# Patient Record
Sex: Female | Born: 1945
Health system: Southern US, Community
[De-identification: ages and names within clinical notes are randomized; demographics above are authoritative.]

## PROBLEM LIST (undated history)

## (undated) DIAGNOSIS — G43909 Migraine, unspecified, not intractable, without status migrainosus: Secondary | ICD-10-CM

## (undated) DIAGNOSIS — M1990 Unspecified osteoarthritis, unspecified site: Secondary | ICD-10-CM

## (undated) DIAGNOSIS — H269 Unspecified cataract: Secondary | ICD-10-CM

## (undated) DIAGNOSIS — E785 Hyperlipidemia, unspecified: Secondary | ICD-10-CM

## (undated) DIAGNOSIS — I10 Essential (primary) hypertension: Secondary | ICD-10-CM

## (undated) HISTORY — PX: ABDOMINAL HYSTERECTOMY: SHX81

## (undated) HISTORY — PX: JOINT REPLACEMENT: SHX530

## (undated) HISTORY — DX: Unspecified cataract: H26.9

## (undated) HISTORY — DX: Hyperlipidemia, unspecified: E78.5

## (undated) HISTORY — DX: Migraine, unspecified, not intractable, without status migrainosus: G43.909

## (undated) HISTORY — PX: ROTATOR CUFF REPAIR: SHX139

## (undated) HISTORY — PX: TONSILLECTOMY: SUR1361

---

## 1998-06-22 ENCOUNTER — Ambulatory Visit (HOSPITAL_COMMUNITY): Admission: RE | Admit: 1998-06-22 | Discharge: 1998-06-22 | Payer: Self-pay | Admitting: Family Medicine

## 1999-06-17 ENCOUNTER — Ambulatory Visit (HOSPITAL_COMMUNITY): Admission: RE | Admit: 1999-06-17 | Discharge: 1999-06-17 | Payer: Self-pay | Admitting: Family Medicine

## 1999-06-17 ENCOUNTER — Encounter: Payer: Self-pay | Admitting: Family Medicine

## 2000-03-28 ENCOUNTER — Encounter: Admission: RE | Admit: 2000-03-28 | Discharge: 2000-03-28 | Payer: Self-pay | Admitting: Family Medicine

## 2000-03-28 ENCOUNTER — Encounter: Payer: Self-pay | Admitting: Family Medicine

## 2000-11-14 ENCOUNTER — Encounter: Payer: Self-pay | Admitting: Family Medicine

## 2000-11-14 ENCOUNTER — Ambulatory Visit (HOSPITAL_COMMUNITY): Admission: RE | Admit: 2000-11-14 | Discharge: 2000-11-14 | Payer: Self-pay | Admitting: Family Medicine

## 2001-05-30 ENCOUNTER — Ambulatory Visit (HOSPITAL_COMMUNITY): Admission: RE | Admit: 2001-05-30 | Discharge: 2001-05-30 | Payer: Self-pay | Admitting: Family Medicine

## 2001-09-09 ENCOUNTER — Ambulatory Visit (HOSPITAL_COMMUNITY): Admission: RE | Admit: 2001-09-09 | Discharge: 2001-09-09 | Payer: Self-pay | Admitting: Gastroenterology

## 2003-05-01 ENCOUNTER — Ambulatory Visit (HOSPITAL_COMMUNITY): Admission: RE | Admit: 2003-05-01 | Discharge: 2003-05-01 | Payer: Self-pay | Admitting: Family Medicine

## 2003-09-29 ENCOUNTER — Ambulatory Visit (HOSPITAL_COMMUNITY): Admission: RE | Admit: 2003-09-29 | Discharge: 2003-09-29 | Payer: Self-pay | Admitting: Family Medicine

## 2004-10-04 ENCOUNTER — Ambulatory Visit (HOSPITAL_COMMUNITY): Admission: RE | Admit: 2004-10-04 | Discharge: 2004-10-04 | Payer: Self-pay | Admitting: Family Medicine

## 2004-10-13 ENCOUNTER — Encounter: Admission: RE | Admit: 2004-10-13 | Discharge: 2004-10-13 | Payer: Self-pay | Admitting: Family Medicine

## 2006-01-16 ENCOUNTER — Ambulatory Visit (HOSPITAL_COMMUNITY): Admission: RE | Admit: 2006-01-16 | Discharge: 2006-01-16 | Payer: Self-pay | Admitting: Family Medicine

## 2006-01-24 ENCOUNTER — Emergency Department (HOSPITAL_COMMUNITY): Admission: EM | Admit: 2006-01-24 | Discharge: 2006-01-25 | Payer: Self-pay | Admitting: Emergency Medicine

## 2006-05-01 ENCOUNTER — Ambulatory Visit (HOSPITAL_COMMUNITY): Admission: RE | Admit: 2006-05-01 | Discharge: 2006-05-01 | Payer: Self-pay | Admitting: Family Medicine

## 2006-08-27 ENCOUNTER — Encounter: Admission: RE | Admit: 2006-08-27 | Discharge: 2006-08-27 | Payer: Self-pay | Admitting: Family Medicine

## 2006-10-14 ENCOUNTER — Encounter: Admission: RE | Admit: 2006-10-14 | Discharge: 2006-10-14 | Payer: Self-pay | Admitting: Family Medicine

## 2007-01-22 ENCOUNTER — Ambulatory Visit (HOSPITAL_COMMUNITY): Admission: RE | Admit: 2007-01-22 | Discharge: 2007-01-22 | Payer: Self-pay | Admitting: Family Medicine

## 2007-02-08 ENCOUNTER — Encounter: Admission: RE | Admit: 2007-02-08 | Discharge: 2007-02-08 | Payer: Self-pay | Admitting: Family Medicine

## 2008-03-03 ENCOUNTER — Encounter: Admission: RE | Admit: 2008-03-03 | Discharge: 2008-03-03 | Payer: Self-pay | Admitting: Family Medicine

## 2009-03-09 ENCOUNTER — Encounter: Admission: RE | Admit: 2009-03-09 | Discharge: 2009-03-09 | Payer: Self-pay | Admitting: Family Medicine

## 2009-04-07 ENCOUNTER — Encounter: Admission: RE | Admit: 2009-04-07 | Discharge: 2009-04-07 | Payer: Self-pay | Admitting: Family Medicine

## 2010-03-14 ENCOUNTER — Encounter: Admission: RE | Admit: 2010-03-14 | Discharge: 2010-03-14 | Payer: Self-pay | Admitting: Family Medicine

## 2010-03-17 ENCOUNTER — Encounter: Admission: RE | Admit: 2010-03-17 | Discharge: 2010-03-17 | Payer: Self-pay | Admitting: Family Medicine

## 2010-04-11 ENCOUNTER — Encounter: Admission: RE | Admit: 2010-04-11 | Discharge: 2010-04-11 | Payer: Self-pay | Admitting: Family Medicine

## 2010-09-16 ENCOUNTER — Encounter: Admission: RE | Admit: 2010-09-16 | Discharge: 2010-09-16 | Payer: Self-pay | Admitting: Family Medicine

## 2010-12-24 ENCOUNTER — Encounter: Payer: Self-pay | Admitting: Family Medicine

## 2010-12-25 ENCOUNTER — Encounter: Payer: Self-pay | Admitting: Family Medicine

## 2011-04-03 ENCOUNTER — Other Ambulatory Visit: Payer: Self-pay | Admitting: Family Medicine

## 2011-04-03 DIAGNOSIS — Z1231 Encounter for screening mammogram for malignant neoplasm of breast: Secondary | ICD-10-CM

## 2011-04-06 ENCOUNTER — Ambulatory Visit
Admission: RE | Admit: 2011-04-06 | Discharge: 2011-04-06 | Disposition: A | Discharge status: Home / Self Care | Payer: Medicare Other | Source: Ambulatory Visit | Attending: Family Medicine | Admitting: Family Medicine

## 2011-04-06 DIAGNOSIS — Z1231 Encounter for screening mammogram for malignant neoplasm of breast: Secondary | ICD-10-CM

## 2011-04-21 NOTE — Procedures (Signed)
[facility]  Patient:    Nancy Yu, Nancy Yu Visit Number: [id number] MRN: [id number]          Service Type: END Location: ENDO Attending Physician:  Orland Mustard Proc. Date: 09/09/01 Admit Date:  09/09/2001   CC:         Carola J. Gerri Spore, M.D.   Procedure Report  PROCEDURE:  Colonoscopy.  MEDICATIONS:  Fentanyl 100 mcg, Versed 10 mg IV.  SCOPE:  Pediatric Olympus video colonoscope.  INDICATION:  Strong family history of colon polyps.  DESCRIPTION OF PROCEDURE:  The procedure had been explained to the patient and consent obtained.  With the patient in the left lateral decubitus position, the Olympus pediatric video colonoscope was inserted and advanced under direct visualization.  The prep was excellent.  We reached the cecum without difficulty.  The scope was withdrawn.  The cecum, ascending colon, hepatic flexure, transverse colon, splenic flexure, descending, and sigmoid colon were seen well upon removed.  No polyps or other lesions were seen.  Internal hemorrhoids were seen in the rectum upon removal of the scope.  The scope was withdrawn.  The patient tolerated the procedure well and was maintained on low-flow oxygen and pulse oximeter throughout the procedure.  ASSESSMENT:  No evidence of colon polyps.  PLAN:  Due to the family history, will recommend repeating in five years. Attending Physician:  Orland Mustard DD:  09/09/01 TD:  09/10/01 Job: [id number]

## 2012-01-03 ENCOUNTER — Other Ambulatory Visit: Payer: Self-pay | Admitting: Family Medicine

## 2012-01-03 ENCOUNTER — Ambulatory Visit
Admission: RE | Admit: 2012-01-03 | Discharge: 2012-01-03 | Disposition: A | Discharge status: Home / Self Care | Payer: Medicare Other | Source: Ambulatory Visit | Attending: Family Medicine | Admitting: Family Medicine

## 2012-01-03 DIAGNOSIS — M549 Dorsalgia, unspecified: Secondary | ICD-10-CM

## 2012-03-04 ENCOUNTER — Other Ambulatory Visit: Payer: Self-pay | Admitting: Family Medicine

## 2012-03-04 DIAGNOSIS — Z1231 Encounter for screening mammogram for malignant neoplasm of breast: Secondary | ICD-10-CM

## 2012-04-08 ENCOUNTER — Ambulatory Visit
Admission: RE | Admit: 2012-04-08 | Discharge: 2012-04-08 | Disposition: A | Discharge status: Home / Self Care | Payer: Medicare Other | Source: Ambulatory Visit | Attending: Family Medicine | Admitting: Family Medicine

## 2012-04-08 DIAGNOSIS — Z1231 Encounter for screening mammogram for malignant neoplasm of breast: Secondary | ICD-10-CM

## 2013-07-14 ENCOUNTER — Other Ambulatory Visit: Payer: Self-pay

## 2013-07-14 DIAGNOSIS — Z1231 Encounter for screening mammogram for malignant neoplasm of breast: Secondary | ICD-10-CM

## 2013-07-31 ENCOUNTER — Ambulatory Visit: Payer: Medicare Other

## 2013-08-12 ENCOUNTER — Ambulatory Visit
Admission: RE | Admit: 2013-08-12 | Discharge: 2013-08-12 | Disposition: A | Discharge status: Home / Self Care | Payer: Medicare Other | Source: Ambulatory Visit

## 2013-08-12 DIAGNOSIS — Z1231 Encounter for screening mammogram for malignant neoplasm of breast: Secondary | ICD-10-CM

## 2014-03-07 ENCOUNTER — Emergency Department (HOSPITAL_BASED_OUTPATIENT_CLINIC_OR_DEPARTMENT_OTHER)
Admission: EM | Admit: 2014-03-07 | Discharge: 2014-03-07 | Disposition: A | Discharge status: Home / Self Care | Payer: Medicare Other | Attending: Emergency Medicine | Admitting: Emergency Medicine

## 2014-03-07 ENCOUNTER — Encounter (HOSPITAL_BASED_OUTPATIENT_CLINIC_OR_DEPARTMENT_OTHER): Payer: Self-pay | Admitting: Emergency Medicine

## 2014-03-07 ENCOUNTER — Emergency Department (HOSPITAL_BASED_OUTPATIENT_CLINIC_OR_DEPARTMENT_OTHER): Payer: Medicare Other

## 2014-03-07 DIAGNOSIS — M129 Arthropathy, unspecified: Secondary | ICD-10-CM | POA: Insufficient documentation

## 2014-03-07 DIAGNOSIS — W19XXXA Unspecified fall, initial encounter: Secondary | ICD-10-CM | POA: Diagnosis present

## 2014-03-07 DIAGNOSIS — I10 Essential (primary) hypertension: Secondary | ICD-10-CM | POA: Insufficient documentation

## 2014-03-07 DIAGNOSIS — Y9289 Other specified places as the place of occurrence of the external cause: Secondary | ICD-10-CM | POA: Insufficient documentation

## 2014-03-07 DIAGNOSIS — Z79899 Other long term (current) drug therapy: Secondary | ICD-10-CM | POA: Insufficient documentation

## 2014-03-07 DIAGNOSIS — Y93H2 Activity, gardening and landscaping: Secondary | ICD-10-CM | POA: Insufficient documentation

## 2014-03-07 DIAGNOSIS — Z7982 Long term (current) use of aspirin: Secondary | ICD-10-CM | POA: Insufficient documentation

## 2014-03-07 DIAGNOSIS — IMO0002 Reserved for concepts with insufficient information to code with codable children: Secondary | ICD-10-CM | POA: Insufficient documentation

## 2014-03-07 DIAGNOSIS — S0993XA Unspecified injury of face, initial encounter: Secondary | ICD-10-CM | POA: Insufficient documentation

## 2014-03-07 DIAGNOSIS — W010XXA Fall on same level from slipping, tripping and stumbling without subsequent striking against object, initial encounter: Secondary | ICD-10-CM | POA: Insufficient documentation

## 2014-03-07 DIAGNOSIS — S0081XA Abrasion of other part of head, initial encounter: Secondary | ICD-10-CM | POA: Diagnosis present

## 2014-03-07 DIAGNOSIS — S199XXA Unspecified injury of neck, initial encounter: Secondary | ICD-10-CM

## 2014-03-07 DIAGNOSIS — Z23 Encounter for immunization: Secondary | ICD-10-CM | POA: Insufficient documentation

## 2014-03-07 HISTORY — DX: Essential (primary) hypertension: I10

## 2014-03-07 HISTORY — DX: Unspecified osteoarthritis, unspecified site: M19.90

## 2014-03-07 MED ORDER — ACETAMINOPHEN 325 MG PO TABS
650.0000 mg | ORAL_TABLET | Freq: Once | ORAL | Status: AC
  Administered 2014-03-07: 650 mg via ORAL
  Filled 2014-03-07: qty 2

## 2014-03-07 MED ORDER — TETANUS-DIPHTH-ACELL PERTUSSIS 5-2.5-18.5 LF-MCG/0.5 IM SUSP
0.5000 mL | Freq: Once | INTRAMUSCULAR | Status: AC
  Administered 2014-03-07: 0.5 mL via INTRAMUSCULAR
  Filled 2014-03-07: qty 0.5

## 2014-03-07 NOTE — Discharge Instructions (Signed)
Abrasion °An abrasion is a cut or scrape of the skin. Abrasions do not extend through all layers of the skin and most heal within 10 days. It is important to care for your abrasion properly to prevent infection. °CAUSES  °Most abrasions are caused by falling on, or gliding across, the ground or other surface. When your skin rubs on something, the outer and inner layer of skin rubs off, causing an abrasion. °DIAGNOSIS  °Your caregiver will be able to diagnose an abrasion during a physical exam.  °TREATMENT  °Your treatment depends on how large and deep the abrasion is. Generally, your abrasion will be cleaned with water and a mild soap to remove any dirt or debris. An antibiotic ointment may be put over the abrasion to prevent an infection. A bandage (dressing) may be wrapped around the abrasion to keep it from getting dirty.  °You may need a tetanus shot if: °· You cannot remember when you had your last tetanus shot. °· You have never had a tetanus shot. °· The injury broke your skin. °If you get a tetanus shot, your arm may swell, get red, and feel warm to the touch. This is common and not a problem. If you need a tetanus shot and you choose not to have one, there is a rare chance of getting tetanus. Sickness from tetanus can be serious.  °HOME CARE INSTRUCTIONS  °· If a dressing was applied, change it at least once a day or as directed by your caregiver. If the bandage sticks, soak it off with warm water.   °· Wash the area with water and a mild soap to remove all the ointment 2 times a day. Rinse off the soap and pat the area dry with a clean towel.   °· Reapply any ointment as directed by your caregiver. This will help prevent infection and keep the bandage from sticking. Use gauze over the wound and under the dressing to help keep the bandage from sticking.   °· Change your dressing right away if it becomes wet or dirty.   °· Only take over-the-counter or prescription medicines for pain, discomfort, or fever as  directed by your caregiver.   °· Follow up with your caregiver within 24 48 hours for a wound check, or as directed. If you were not given a wound-check appointment, look closely at your abrasion for redness, swelling, or pus. These are signs of infection. °SEEK IMMEDIATE MEDICAL CARE IF:  °· You have increasing pain in the wound.   °· You have redness, swelling, or tenderness around the wound.   °· You have pus coming from the wound.   °· You have a fever or persistent symptoms for more than 2 3 days. °· You have a fever and your symptoms suddenly get worse. °· You have a bad smell coming from the wound or dressing.   °MAKE SURE YOU:  °· Understand these instructions. °· Will watch your condition. °· Will get help right away if you are not doing well or get worse. °Document Released: 08/30/2005 Document Revised: 11/06/2012 Document Reviewed: 10/24/2011 °[employer]® Patient Information ©2014 [employer]. ° °

## 2014-03-07 NOTE — ED Provider Notes (Signed)
CSN: [id number]     Arrival date & time 03/07/14  1700 History  This chart was scribed for Blanchard Kelch, MD by Mercy Moore, ED scribe.  This patient was seen in room MH06/MH06 and the patient's care was started at 5:42 PM.   Chief Complaint  Patient presents with  . Fall      Patient is a 68 y.o. female presenting with fall. The history is provided by the patient. No language interpreter was used.  Fall This is a new problem. The current episode started 1 to 2 hours ago. Episode frequency: once. The problem has been resolved. Pertinent negatives include no chest pain, no abdominal pain, no headaches and no shortness of breath. Nothing aggravates the symptoms. Nothing relieves the symptoms. She has tried nothing for the symptoms. The treatment provided no relief.   HPI Comments: Nancy Yu is a 68 y.o. female who presents to the Emergency Department after a fall that occurred two hours ago. Patient reports watering flowers this afternoon, she tripped and fell onto cement head first. Patient reports that her head took the brunt of the fall because she was trying to protect her shoulders. She had recent surgery on her shoulder.   Did not lose LOC Sore neck, back  Certain she is UTD on Tetanus  Past Medical History  Diagnosis Date  . Arthritis   . Hypertension    Past Surgical History  Procedure Laterality Date  . Abdominal hysterectomy    . Tonsillectomy    . Rotator cuff repair     No family history on file. History  Substance Use Topics  . Smoking status: Never Smoker   . Smokeless tobacco: Never Used  . Alcohol Use: No   OB History   Grav Para Term Preterm Abortions TAB SAB Ect Mult Living                 Review of Systems  Constitutional: Negative for fever.  HENT: Negative for congestion and sore throat.   Eyes: Negative for pain.  Respiratory: Negative for choking, shortness of breath and wheezing.   Cardiovascular: Negative for chest pain.   Gastrointestinal: Negative for vomiting, abdominal pain and diarrhea.  Genitourinary: Negative for dysuria.  Musculoskeletal: Positive for neck pain.  Skin: Negative for rash.  Allergic/Immunologic: Negative for immunocompromised state.  Neurological: Negative for headaches.  Hematological: Negative for adenopathy.  Psychiatric/Behavioral: Negative for behavioral problems.      Allergies  Review of patient's allergies indicates no known allergies.  Home Medications   Current Outpatient Rx  Name  Route  Sig  Dispense  Refill  . amLODipine (NORVASC) 5 MG tablet   Oral   Take 5 mg by mouth daily.         Marland Kitchen aspirin 81 MG tablet   Oral   Take 81 mg by mouth daily.         . cetirizine (ZYRTEC) 10 MG tablet   Oral   Take 10 mg by mouth daily.         . cholecalciferol (VITAMIN D) 1000 UNITS tablet   Oral   Take 1,000 Units by mouth daily.         Marland Kitchen CRANBERRY EXTRACT PO   Oral   Take by mouth.         . fenofibrate 160 MG tablet   Oral   Take 160 mg by mouth daily.         . Fish Oil OIL  Does not apply   by Does not apply route.         . metoprolol (LOPRESSOR) 100 MG tablet   Oral   Take 100 mg by mouth 2 (two) times daily.         . Multiple Vitamin (MULTIVITAMIN) capsule   Oral   Take 1 capsule by mouth daily.          Triage Vitals: BP 170/67  Pulse 61  Temp(Src) 98.1 F (36.7 C) (Oral)  Resp 20  Ht 5\' 3"  (1.6 m)  Wt 168 lb (76.204 kg)  BMI 29.77 kg/m2  SpO2 98% Physical Exam  Nursing note and vitals reviewed. Constitutional: She is oriented to person, place, and time. She appears well-developed and well-nourished. No distress.  HENT:  Head: Normocephalic.  Mouth/Throat: Oropharynx is clear and moist. No oropharyngeal exudate.  Multiple mild abrasions to face. No focal tenderness of face.   Eyes: Conjunctivae and EOM are normal. Pupils are equal, round, and reactive to light. Right eye exhibits no discharge. Left eye  exhibits no discharge.  Neck: Neck supple. No tracheal deviation present.  Mild mid cervical tenderness to palpation.  Cardiovascular: Normal rate, regular rhythm, normal heart sounds and intact distal pulses.  Exam reveals no gallop and no friction rub.   No murmur heard. Pulmonary/Chest: Effort normal and breath sounds normal. No respiratory distress. She has no wheezes. She has no rales. She exhibits no tenderness.  Abdominal: Soft. Bowel sounds are normal. She exhibits no distension and no mass. There is no tenderness. There is no rebound and no guarding.  Musculoskeletal: Normal range of motion. She exhibits no edema and no tenderness.  Normal range of motion of hips without pain.  Normal strength and sensation in all extremities.   Neurological: She is alert and oriented to person, place, and time.  Skin: Skin is warm and dry. No rash noted.  Psychiatric: She has a normal mood and affect. Her behavior is normal.    ED Course  Procedures (including critical care time) DIAGNOSTIC STUDIES: Oxygen Saturation is 98% on room air, normal by my interpretation.    COORDINATION OF CARE: 5:43 PM- Pt advised of plan for treatment and pt agrees.    Labs Review Labs Reviewed - No data to display Imaging Review Ct Head Wo Contrast  03/07/2014   CLINICAL DATA:  Fall.  EXAM: CT HEAD WITHOUT CONTRAST  CT MAXILLOFACIAL WITHOUT CONTRAST  CT CERVICAL SPINE WITHOUT CONTRAST  TECHNIQUE: Multidetector CT imaging of the head, cervical spine, and maxillofacial structures were performed using the standard protocol without intravenous contrast. Multiplanar CT image reconstructions of the cervical spine and maxillofacial structures were also generated.  COMPARISON:  CT HEAD W/O CM dated 01/25/2006; MRI Cervical Spine w/o contrast dated 05/17/2009  FINDINGS: CT HEAD FINDINGS  No mass. No hydrocephalus. No hemorrhage. Frontal soft tissue swelling is noted. No underlying fracture.  CT MAXILLOFACIAL FINDINGS  Soft  tissue swelling is in the frontal region. No underlying fracture. Visualized paranasal sinuses are clear. Zygomas are clear. Orbits are intact. Nasal septal deviation to the left is 5 present. Nasal spurring is noted. Mandible is intact and in place.  CT CERVICAL SPINE FINDINGS  Noted in the right paraspinal region adjacent to T2 and to a lesser extent the left paraspinal region is soft tissue prominence. Although these changes could represent pleural parenchymal scarring a posterior mediastinum/ paraspinal mass lesion cannot be excluded. In addition mild esophageal wall thickening is noted. Nonemergent CT of the  chest with contrast enhancement suggested for further evaluation of these findings. Diffuse degenerative changes present of the cervical spine. Prominent endplate osteophyte formation, severe disc degeneration and diffuse severe facet hypertrophy is present. Disc degeneration is particularly severe C5-C6 and C6-C7. Partial congenital fusion of C2-C3 is noted. Mild anterolisthesis C6 on C7 present. This is most likely degenerative. Ligamentous injury cannot be completely excluded.  IMPRESSION: 1. Mild soft tissue swelling noted over the frontal region. No underlying acute bony abnormality. No acute intracranial abnormality. 2. Paraspinal soft tissue prominence upper thoracic spine particularly along the right aspect of T2. This may just represent pleural parenchymal scarring. However significant mass lesion cannot be excluded. In addition mild esophageal wall thickening is noted. Nonemergent contrast-enhanced chest CT is suggested for further evaluation of these findings. 3. Severe degenerative changes of the cervical spine. No evidence of fracture or dislocation.   Electronically Signed   By: Sayner   On: 03/07/2014 18:53   Ct Cervical Spine Wo Contrast  03/07/2014   CLINICAL DATA:  Fall.  EXAM: CT HEAD WITHOUT CONTRAST  CT MAXILLOFACIAL WITHOUT CONTRAST  CT CERVICAL SPINE WITHOUT CONTRAST   TECHNIQUE: Multidetector CT imaging of the head, cervical spine, and maxillofacial structures were performed using the standard protocol without intravenous contrast. Multiplanar CT image reconstructions of the cervical spine and maxillofacial structures were also generated.  COMPARISON:  CT HEAD W/O CM dated 01/25/2006; MRI Cervical Spine w/o contrast dated 05/17/2009  FINDINGS: CT HEAD FINDINGS  No mass. No hydrocephalus. No hemorrhage. Frontal soft tissue swelling is noted. No underlying fracture.  CT MAXILLOFACIAL FINDINGS  Soft tissue swelling is in the frontal region. No underlying fracture. Visualized paranasal sinuses are clear. Zygomas are clear. Orbits are intact. Nasal septal deviation to the left is 5 present. Nasal spurring is noted. Mandible is intact and in place.  CT CERVICAL SPINE FINDINGS  Noted in the right paraspinal region adjacent to T2 and to a lesser extent the left paraspinal region is soft tissue prominence. Although these changes could represent pleural parenchymal scarring a posterior mediastinum/ paraspinal mass lesion cannot be excluded. In addition mild esophageal wall thickening is noted. Nonemergent CT of the chest with contrast enhancement suggested for further evaluation of these findings. Diffuse degenerative changes present of the cervical spine. Prominent endplate osteophyte formation, severe disc degeneration and diffuse severe facet hypertrophy is present. Disc degeneration is particularly severe C5-C6 and C6-C7. Partial congenital fusion of C2-C3 is noted. Mild anterolisthesis C6 on C7 present. This is most likely degenerative. Ligamentous injury cannot be completely excluded.  IMPRESSION: 1. Mild soft tissue swelling noted over the frontal region. No underlying acute bony abnormality. No acute intracranial abnormality. 2. Paraspinal soft tissue prominence upper thoracic spine particularly along the right aspect of T2. This may just represent pleural parenchymal scarring.  However significant mass lesion cannot be excluded. In addition mild esophageal wall thickening is noted. Nonemergent contrast-enhanced chest CT is suggested for further evaluation of these findings. 3. Severe degenerative changes of the cervical spine. No evidence of fracture or dislocation.   Electronically Signed   By: Marcello Moores  Register   On: 03/07/2014 18:53   Ct Maxillofacial Wo Cm  03/07/2014   CLINICAL DATA:  Fall.  EXAM: CT HEAD WITHOUT CONTRAST  CT MAXILLOFACIAL WITHOUT CONTRAST  CT CERVICAL SPINE WITHOUT CONTRAST  TECHNIQUE: Multidetector CT imaging of the head, cervical spine, and maxillofacial structures were performed using the standard protocol without intravenous contrast. Multiplanar CT image reconstructions of the cervical spine and  maxillofacial structures were also generated.  COMPARISON:  CT HEAD W/O CM dated 01/25/2006; MRI Cervical Spine w/o contrast dated 05/17/2009  FINDINGS: CT HEAD FINDINGS  No mass. No hydrocephalus. No hemorrhage. Frontal soft tissue swelling is noted. No underlying fracture.  CT MAXILLOFACIAL FINDINGS  Soft tissue swelling is in the frontal region. No underlying fracture. Visualized paranasal sinuses are clear. Zygomas are clear. Orbits are intact. Nasal septal deviation to the left is 5 present. Nasal spurring is noted. Mandible is intact and in place.  CT CERVICAL SPINE FINDINGS  Noted in the right paraspinal region adjacent to T2 and to a lesser extent the left paraspinal region is soft tissue prominence. Although these changes could represent pleural parenchymal scarring a posterior mediastinum/ paraspinal mass lesion cannot be excluded. In addition mild esophageal wall thickening is noted. Nonemergent CT of the chest with contrast enhancement suggested for further evaluation of these findings. Diffuse degenerative changes present of the cervical spine. Prominent endplate osteophyte formation, severe disc degeneration and diffuse severe facet hypertrophy is present.  Disc degeneration is particularly severe C5-C6 and C6-C7. Partial congenital fusion of C2-C3 is noted. Mild anterolisthesis C6 on C7 present. This is most likely degenerative. Ligamentous injury cannot be completely excluded.  IMPRESSION: 1. Mild soft tissue swelling noted over the frontal region. No underlying acute bony abnormality. No acute intracranial abnormality. 2. Paraspinal soft tissue prominence upper thoracic spine particularly along the right aspect of T2. This may just represent pleural parenchymal scarring. However significant mass lesion cannot be excluded. In addition mild esophageal wall thickening is noted. Nonemergent contrast-enhanced chest CT is suggested for further evaluation of these findings. 3. Severe degenerative changes of the cervical spine. No evidence of fracture or dislocation.   Electronically Signed   By: Marcello Moores  Register   On: 03/07/2014 18:53     EKG Interpretation None      MDM   Final diagnoses:  Abrasion of face  Fall    7:23 PM 68 y.o. female who presents with a mechanical fall prior to arrival. She denies loss of consciousness. Afebrile and vital signs are unremarkable here. Will get screening imaging.  7:23 PM: I interpreted/reviewed the labs and/or imaging which were non-contributory.   I have discussed the diagnosis/risks/treatment options with the patient and family and believe the pt to be eligible for discharge home to follow-up with pcp as needed. We also discussed returning to the ED immediately if new or worsening sx occur. We discussed the sx which are most concerning (e.g., worsening pain) that necessitate immediate return. Medications administered to the patient during their visit and any new prescriptions provided to the patient are listed below.  Medications given during this visit Medications  acetaminophen (TYLENOL) tablet 650 mg (650 mg Oral Given 03/07/14 1753)  Tdap (BOOSTRIX) injection 0.5 mL (0.5 mLs Intramuscular Given 03/07/14 1758)     New Prescriptions   No medications on file      I personally performed the services described in this documentation, which was scribed in my presence. The recorded information has been reviewed and is accurate.    Blanchard Kelch, MD 03/08/14 1215

## 2014-03-07 NOTE — ED Notes (Signed)
Returned from CT.

## 2014-03-07 NOTE — ED Notes (Signed)
Patient transported to X-ray 

## 2014-03-07 NOTE — ED Notes (Signed)
Pt reports she tripped on grden hose and fell and landed on her face- abrasions present to face- denies LOC

## 2014-07-08 ENCOUNTER — Other Ambulatory Visit: Payer: Self-pay

## 2014-07-08 DIAGNOSIS — Z1231 Encounter for screening mammogram for malignant neoplasm of breast: Secondary | ICD-10-CM

## 2014-08-14 ENCOUNTER — Ambulatory Visit: Payer: Medicare Other

## 2014-08-27 ENCOUNTER — Encounter (INDEPENDENT_AMBULATORY_CARE_PROVIDER_SITE_OTHER): Payer: Self-pay

## 2014-08-27 ENCOUNTER — Ambulatory Visit
Admission: RE | Admit: 2014-08-27 | Discharge: 2014-08-27 | Disposition: A | Discharge status: Home / Self Care | Payer: Medicare Other | Source: Ambulatory Visit

## 2014-08-27 DIAGNOSIS — Z1231 Encounter for screening mammogram for malignant neoplasm of breast: Secondary | ICD-10-CM

## 2014-08-31 ENCOUNTER — Other Ambulatory Visit: Payer: Self-pay | Admitting: Family Medicine

## 2014-08-31 DIAGNOSIS — R928 Other abnormal and inconclusive findings on diagnostic imaging of breast: Secondary | ICD-10-CM

## 2014-09-04 ENCOUNTER — Ambulatory Visit
Admission: RE | Admit: 2014-09-04 | Discharge: 2014-09-04 | Disposition: A | Discharge status: Home / Self Care | Payer: Medicare Other | Source: Ambulatory Visit | Attending: Family Medicine | Admitting: Family Medicine

## 2014-09-04 DIAGNOSIS — R928 Other abnormal and inconclusive findings on diagnostic imaging of breast: Secondary | ICD-10-CM

## 2015-08-11 ENCOUNTER — Other Ambulatory Visit: Payer: Self-pay

## 2015-08-11 DIAGNOSIS — Z1231 Encounter for screening mammogram for malignant neoplasm of breast: Secondary | ICD-10-CM

## 2015-09-15 ENCOUNTER — Ambulatory Visit
Admission: RE | Admit: 2015-09-15 | Discharge: 2015-09-15 | Disposition: A | Discharge status: Home / Self Care | Payer: Medicare Other | Source: Ambulatory Visit

## 2015-09-15 DIAGNOSIS — Z1231 Encounter for screening mammogram for malignant neoplasm of breast: Secondary | ICD-10-CM

## 2016-01-05 ENCOUNTER — Emergency Department (HOSPITAL_COMMUNITY): Payer: Medicare Other

## 2016-01-05 ENCOUNTER — Emergency Department (HOSPITAL_COMMUNITY)
Admission: EM | Admit: 2016-01-05 | Discharge: 2016-01-05 | Disposition: A | Discharge status: Home / Self Care | Payer: Medicare Other | Attending: Emergency Medicine | Admitting: Emergency Medicine

## 2016-01-05 ENCOUNTER — Encounter (HOSPITAL_COMMUNITY): Payer: Self-pay | Admitting: Emergency Medicine

## 2016-01-05 DIAGNOSIS — I493 Ventricular premature depolarization: Secondary | ICD-10-CM

## 2016-01-05 DIAGNOSIS — Z79899 Other long term (current) drug therapy: Secondary | ICD-10-CM | POA: Insufficient documentation

## 2016-01-05 DIAGNOSIS — R740 Nonspecific elevation of levels of transaminase and lactic acid dehydrogenase [LDH]: Secondary | ICD-10-CM | POA: Insufficient documentation

## 2016-01-05 DIAGNOSIS — M1990 Unspecified osteoarthritis, unspecified site: Secondary | ICD-10-CM | POA: Diagnosis not present

## 2016-01-05 DIAGNOSIS — R531 Weakness: Secondary | ICD-10-CM

## 2016-01-05 DIAGNOSIS — I10 Essential (primary) hypertension: Secondary | ICD-10-CM | POA: Insufficient documentation

## 2016-01-05 DIAGNOSIS — Z7982 Long term (current) use of aspirin: Secondary | ICD-10-CM | POA: Insufficient documentation

## 2016-01-05 DIAGNOSIS — R7401 Elevation of levels of liver transaminase levels: Secondary | ICD-10-CM

## 2016-01-05 DIAGNOSIS — R079 Chest pain, unspecified: Secondary | ICD-10-CM | POA: Diagnosis present

## 2016-01-05 LAB — URINALYSIS, ROUTINE W REFLEX MICROSCOPIC
BILIRUBIN URINE: NEGATIVE
GLUCOSE, UA: NEGATIVE mg/dL
Hgb urine dipstick: NEGATIVE
Ketones, ur: NEGATIVE mg/dL
Leukocytes, UA: NEGATIVE
NITRITE: NEGATIVE
PH: 7 (ref 5.0–8.0)
Protein, ur: NEGATIVE mg/dL
SPECIFIC GRAVITY, URINE: 1.008 (ref 1.005–1.030)

## 2016-01-05 LAB — BASIC METABOLIC PANEL
Anion gap: 13 (ref 5–15)
BUN: 17 mg/dL (ref 6–20)
CALCIUM: 10.9 mg/dL — AB (ref 8.9–10.3)
CHLORIDE: 100 mmol/L — AB (ref 101–111)
CO2: 27 mmol/L (ref 22–32)
CREATININE: 0.79 mg/dL (ref 0.44–1.00)
GFR calc Af Amer: >60 mL/min (ref >60)
GFR calc non Af Amer: >60 mL/min (ref >60)
GLUCOSE: 135 mg/dL — AB (ref 65–99)
Potassium: 3.8 mmol/L (ref 3.5–5.1)
Sodium: 140 mmol/L (ref 135–145)

## 2016-01-05 LAB — CBC
HCT: 47.3 % — ABNORMAL HIGH (ref 36.0–46.0)
Hemoglobin: 16.9 g/dL — ABNORMAL HIGH (ref 12.0–15.0)
MCH: 30.8 pg (ref 26.0–34.0)
MCHC: 35.7 g/dL (ref 30.0–36.0)
MCV: 86.3 fL (ref 78.0–100.0)
PLATELETS: 164 10*3/uL (ref 150–400)
RBC: 5.48 MIL/uL — AB (ref 3.87–5.11)
RDW: 12.5 % (ref 11.5–15.5)
WBC: 5.5 10*3/uL (ref 4.0–10.5)

## 2016-01-05 LAB — HEPATIC FUNCTION PANEL
ALBUMIN: 4.6 g/dL (ref 3.5–5.0)
ALK PHOS: 57 U/L (ref 38–126)
ALT: 67 U/L — ABNORMAL HIGH (ref 14–54)
AST: 62 U/L — ABNORMAL HIGH (ref 15–41)
BILIRUBIN DIRECT: 0.2 mg/dL (ref 0.1–0.5)
BILIRUBIN TOTAL: 0.5 mg/dL (ref 0.3–1.2)
Indirect Bilirubin: 0.3 mg/dL (ref 0.3–0.9)
Total Protein: 7.5 g/dL (ref 6.5–8.1)

## 2016-01-05 LAB — I-STAT TROPONIN, ED: TROPONIN I, POC: 0.01 ng/mL (ref 0.00–0.08)

## 2016-01-05 MED ORDER — POTASSIUM CHLORIDE CRYS ER 20 MEQ PO TBCR: 20.0000 meq | EXTENDED_RELEASE_TABLET | Freq: Every day | ORAL | Status: DC

## 2016-01-05 MED ORDER — POTASSIUM CHLORIDE CRYS ER 20 MEQ PO TBCR
40.0000 meq | EXTENDED_RELEASE_TABLET | Freq: Once | ORAL | Status: AC
  Administered 2016-01-05: 40 meq via ORAL
  Filled 2016-01-05: qty 2

## 2016-01-05 NOTE — ED Provider Notes (Signed)
CSN: [id number]     Arrival date & time 01/05/16  1748 History   First MD Initiated Contact with Patient 01/05/16 [facility]     Chief Complaint  Patient presents with  . Chest Pain  . Fatigue     (Consider location/radiation/quality/duration/timing/severity/associated sxs/prior Treatment) Patient is a 70 y.o. female presenting with chest pain. The history is provided by the patient.  Chest Pain She complains of feeling generally weak for the last 4 days. She denies fever, chills, sweats. She denies chest pain to me but noted that her blood pressure monitor showed her heart rate in the 40s. She denies dizziness or lightheadedness. She denies nausea or vomiting. She denies dyspnea or cough and denies dysuria. Her main concern today is actually her low heart rate.  Past Medical History  Diagnosis Date  . Arthritis   . Hypertension    Past Surgical History  Procedure Laterality Date  . Abdominal hysterectomy    . Tonsillectomy    . Rotator cuff repair     No family history on file. Social History  Substance Use Topics  . Smoking status: Never Smoker   . Smokeless tobacco: Never Used  . Alcohol Use: No   OB History    No data available     Review of Systems  Cardiovascular: Positive for chest pain.  All other systems reviewed and are negative.     Allergies  Review of patient's allergies indicates no known allergies.  Home Medications   Prior to Admission medications   Medication Sig Start Date End Date Taking? Authorizing Provider  amLODipine (NORVASC) 5 MG tablet Take 5 mg by mouth daily.    Historical Provider, MD  aspirin 81 MG tablet Take 81 mg by mouth daily.    Historical Provider, MD  cetirizine (ZYRTEC) 10 MG tablet Take 10 mg by mouth daily.    Historical Provider, MD  cholecalciferol (VITAMIN D) 1000 UNITS tablet Take 1,000 Units by mouth daily.    Historical Provider, MD  CRANBERRY EXTRACT PO Take by mouth.    Historical Provider, MD  fenofibrate 160 MG tablet  Take 160 mg by mouth daily.    Historical Provider, MD  Fish Oil OIL by Does not apply route.    Historical Provider, MD  metoprolol (LOPRESSOR) 100 MG tablet Take 100 mg by mouth 2 (two) times daily.    Historical Provider, MD  Multiple Vitamin (MULTIVITAMIN) capsule Take 1 capsule by mouth daily.    Historical Provider, MD   BP 173/101 mmHg  Pulse 70  Temp(Src) 97.7 F (36.5 C) (Oral)  Resp 18  SpO2 99% Physical Exam  Nursing note and vitals reviewed.  70 year old female, resting comfortably and in no acute distress. Vital signs are significant for hypertension. Oxygen saturation is 99%, which is normal. Head is normocephalic and atraumatic. PERRLA, EOMI. Oropharynx is clear. Neck is nontender and supple without adenopathy or JVD. Back is nontender and there is no CVA tenderness. Lungs are clear without rales, wheezes, or rhonchi. Chest is nontender. Heart has regular rate and rhythm with frequent premature beats. No murmur is heard. Abdomen is soft, flat, nontender without masses or hepatosplenomegaly and peristalsis is normoactive. Extremities have no cyanosis or edema, full range of motion is present. Skin is warm and dry without rash. Neurologic: Mental status is normal, cranial nerves are intact, there are no motor or sensory deficits.  ED Course  Procedures (including critical care time) Labs Review Results for orders placed or performed during  the hospital encounter of [id number]  Basic metabolic panel  Result Value Ref Range   Sodium 140 135 - 145 mmol/L   Potassium 3.8 3.5 - 5.1 mmol/L   Chloride 100 (L) 101 - 111 mmol/L   CO2 27 22 - 32 mmol/L   Glucose, Bld 135 (H) 65 - 99 mg/dL   BUN 17 6 - 20 mg/dL   Creatinine, Ser 0.79 0.44 - 1.00 mg/dL   Calcium 10.9 (H) 8.9 - 10.3 mg/dL   GFR calc non Af Amer >60 >60 mL/min   GFR calc Af Amer >60 >60 mL/min   Anion gap 13 5 - 15  CBC  Result Value Ref Range   WBC 5.5 4.0 - 10.5 K/uL   RBC 5.48 (H) 3.87 - 5.11 MIL/uL    Hemoglobin 16.9 (H) 12.0 - 15.0 g/dL   HCT 47.3 (H) 36.0 - 46.0 %   MCV 86.3 78.0 - 100.0 fL   MCH 30.8 26.0 - 34.0 pg   MCHC 35.7 30.0 - 36.0 g/dL   RDW 12.5 11.5 - 15.5 %   Platelets 164 150 - 400 K/uL  I-stat troponin, ED (not at [facility], [facility])  Result Value Ref Range   Troponin i, poc 0.01 0.00 - 0.08 ng/mL   Comment 3           Imaging Review Dg Chest 2 View  01/05/2016  CLINICAL DATA:  Shortness of breath, mid chest pain, intermittent right arm pain and weakness for 1 week. EXAM: CHEST  2 VIEW COMPARISON:  None. FINDINGS: Heart is normal in size. There is tortuosity of the thoracic aorta. The lungs are clear. Pulmonary vasculature is normal. No consolidation, pleural effusion, or pneumothorax. No acute osseous abnormalities are seen. Findings consistent with diffuse idiopathic skeletal hyperostosis unchanged compared to a thoracic spine radiograph 01/03/2012. IMPRESSION: No acute pulmonary process. Electronically Signed   By: Jeb Levering M.D.   On: 01/05/2016 18:36   I have personally reviewed and evaluated these images and lab results as part of my medical decision-making.   EKG Interpretation   Date/Time:  Wednesday January 05 2016 17:58:00 EST Ventricular Rate:  76 PR Interval:  172 QRS Duration: 108 QT Interval:  432 QTC Calculation: 486 R Axis:   -53 Text Interpretation:  Sinus rhythm with frequent Premature ventricular  complexes and Premature atrial complexes Left anterior fascicular block  Left ventricular hypertrophy with repolarization abnormality Abnormal ECG  When compared with ECG of 01/25/2006, Premature ventricular complexes are  now Present Left anterior fasicular block is now Present Left ventricular  hypertrophy with repolarization abnormality is now Present Confirmed by  [facility]  MD, Zorian Gunderman ([id number]) on 01/05/2016 6:34:23 PM      MDM   Final diagnoses:  Weakness  PVC's (premature ventricular contractions)  Elevated transaminase level  Hypercalcemia     Generalized weakness of uncertain cause. Monitor and ECG show frequent PVCs with episodes of bigeminy. This seems to be what is behind her blood pressure monitor interpreting low heart rate. Have explained this to the patient. Potassium is noted to be 3.8 which, while still in the normal range, might be leading to some increase tendency towards PVCs. She is given a dose of K-Dur. CBC is normal and metabolic panel is significant only for borderline hypercalcemia which is probably not clinically significant. Urinalysis is pending. Old records are reviewed and she has no relevant past visits.  She feels better after above noted treatment. Frequency of PVCs seems to have decreased. Remainder of  laboratory workup is significant for mild elevation of AST and ALT which is unlikely to be responsible for her symptoms but will need to be followed, along with her calcium. Patient is advised of these findings. She is discharged with prescription for K-dur and she is to follow-up with her PCP in one week. Return precautions given.  Delora Fuel, MD [id number]

## 2016-01-05 NOTE — ED Notes (Signed)
Pt reports that she has been having weakness x 4 days with intermittent CP starting last night. Pt went to her MD and her initial HR was 44, they captured an EKG but her HR had improved to 70's. Pt alert x4. Pt having intermittent runs of Bi-Gem on monitor.

## 2016-01-05 NOTE — Discharge Instructions (Signed)
Your blood tests today showed slightly elevated calcium, AST, ALT. These need to be rechecked by her doctor in the next 1-2 weeks.  Weakness Weakness is a lack of strength. It may be felt all over the body (generalized) or in one specific part of the body (focal). Some causes of weakness can be serious. You may need further medical evaluation, especially if you are elderly or you have a history of immunosuppression (such as chemotherapy or HIV), kidney disease, heart disease, or diabetes. CAUSES  Weakness can be caused by many different things, including:  Infection.  Physical exhaustion.  Internal bleeding or other blood loss that results in a lack of red blood cells (anemia).  Dehydration. This cause is more common in elderly people.  Side effects or electrolyte abnormalities from medicines, such as pain medicines or sedatives.  Emotional distress, anxiety, or depression.  Circulation problems, especially severe peripheral arterial disease.  Heart disease, such as rapid atrial fibrillation, bradycardia, or heart failure.  Nervous system disorders, such as Guillain-Barr syndrome, multiple sclerosis, or stroke. DIAGNOSIS  To find the cause of your weakness, your caregiver will take your history and perform a physical exam. Lab tests or X-rays may also be ordered, if needed. TREATMENT  Treatment of weakness depends on the cause of your symptoms and can vary greatly. HOME CARE INSTRUCTIONS   Rest as needed.  Eat a well-balanced diet.  Try to get some exercise every day.  Only take over-the-counter or prescription medicines as directed by your caregiver. SEEK MEDICAL CARE IF:   Your weakness seems to be getting worse or spreads to other parts of your body.  You develop new aches or pains. SEEK IMMEDIATE MEDICAL CARE IF:   You cannot perform your normal daily activities, such as getting dressed and feeding yourself.  You cannot walk up and down stairs, or you feel exhausted  when you do so.  You have shortness of breath or chest pain.  You have difficulty moving parts of your body.  You have weakness in only one area of the body or on only one side of the body.  You have a fever.  You have trouble speaking or swallowing.  You cannot control your bladder or bowel movements.  You have black or bloody vomit or stools. MAKE SURE YOU:  Understand these instructions.  Will watch your condition.  Will get help right away if you are not doing well or get worse.   This information is not intended to replace advice given to you by your health care provider. Make sure you discuss any questions you have with your health care provider.   Document Released: 11/20/2005 Document Revised: 05/21/2012 Document Reviewed: 01/19/2012 Elsevier Interactive Patient Education 2016 Elsevier Inc.  Potassium Salts tablets, extended-release tablets or capsules What is this medicine? POTASSIUM (poe TASS i um) is a natural salt that is important for the heart, muscles, and nerves. It is found in many foods and is normally supplied by a well balanced diet. This medicine is used to treat low potassium. This medicine may be used for other purposes; ask your health care provider or pharmacist if you have questions. What should I tell my health care provider before I take this medicine? They need to know if you have any of these conditions: -Addison's disease -dehydration -diabetes -difficulty swallowing -heart disease -history of high levels of potassium in the blood -irregular heartbeat -kidney disease -recent severe burn -stomach ulcers or other stomach problems -an unusual or allergic reaction to  potassium, tartrazine, other medicines, foods, dyes, or preservatives -pregnant or trying to get pregnant -breast-feeding How should I use this medicine? Take this medicine by mouth with a full glass of water. Take with food. Follow the directions on the prescription label. Do  not suck on, crush, or chew this medicine. If you have difficulty swallowing, ask the pharmacist how to take. Take your medicine at regular intervals. Do not take it more often than directed. Do not stop taking except on your doctor's advice. Talk to your pediatrician regarding the use of this medicine in children. Special care may be needed. Overdosage: If you think you have taken too much of this medicine contact a poison control center or emergency room at once. NOTE: This medicine is only for you. Do not share this medicine with others. What if I miss a dose? If you miss a dose, take it as soon as you can. If it is almost time for your next dose, take only that dose. Do not take double or extra doses. What may interact with this medicine? Do not take this medicine with any of the following medications: -eplerenone -certain medicines for stomach problems like atropine; difenoxin and glycopyrrolate -sodium polystyrene sulfonate This medicine may also interact with the following medications: -certain medicines for blood pressure or heart disease like lisinopril, losartan, quinapril, valsartan -medicines for cold or allergies -NSAIDs, medicines for pain and inflammation, like ibuprofen or napoxen -other potassium supplements -salt substitutes -some diuretics This list may not describe all possible interactions. Give your health care provider a list of all the medicines, herbs, non-prescription drugs, or dietary supplements you use. Also tell them if you smoke, drink alcohol, or use illegal drugs. Some items may interact with your medicine. What should I watch for while using this medicine? Visit your doctor or health care professional for regular check ups. You will need lab work done regularly. You may need to be on a special diet while taking this medicine. Ask your doctor. What side effects may I notice from receiving this medicine? Side effects that you should report to your doctor or  health care professional as soon as possible: -allergic reactions like skin rash, itching or hives, swelling of the face, lips, or tongue -anxious -black, tarry stools -breathing problems -confusion -heartburn -irregular heartbeat -numbness or tingling in hands or feet -pain when swallowing -unusually weak or tired -weakness, heaviness of legs Side effects that usually do not require medical attention (report to your doctor or health care professional if they continue or are bothersome): -diarrhea -nausea -upset stomach -vomiting This list may not describe all possible side effects. Call your doctor for medical advice about side effects. You may report side effects to FDA at 1-800-FDA-1088. Where should I keep my medicine? Keep out of the reach of children. Store at room temperature between 15 and 30 degrees C (59 and 86 degrees F ). Keep bottle closed tightly to protect this medicine from light and moisture. Throw away any unused medicine after the expiration date. NOTE: This sheet is a summary. It may not cover all possible information. If you have questions about this medicine, talk to your doctor, pharmacist, or health care provider.    2016, Elsevier/Gold Standard. (2015-04-29 08:55:21)

## 2016-01-25 ENCOUNTER — Encounter: Payer: Self-pay | Admitting: Cardiovascular Disease

## 2016-01-25 ENCOUNTER — Ambulatory Visit (INDEPENDENT_AMBULATORY_CARE_PROVIDER_SITE_OTHER): Payer: Medicare Other | Admitting: Cardiovascular Disease

## 2016-01-25 VITALS — BP 160/70 | HR 60 | Ht 63.0 in | Wt 174.2 lb

## 2016-01-25 DIAGNOSIS — I10 Essential (primary) hypertension: Secondary | ICD-10-CM

## 2016-01-25 DIAGNOSIS — I493 Ventricular premature depolarization: Secondary | ICD-10-CM | POA: Diagnosis not present

## 2016-01-25 DIAGNOSIS — R0602 Shortness of breath: Secondary | ICD-10-CM | POA: Diagnosis not present

## 2016-01-25 DIAGNOSIS — R0600 Dyspnea, unspecified: Secondary | ICD-10-CM

## 2016-01-25 DIAGNOSIS — R0609 Other forms of dyspnea: Secondary | ICD-10-CM | POA: Diagnosis not present

## 2016-01-25 MED ORDER — CARVEDILOL 12.5 MG PO TABS: 12.5000 mg | ORAL_TABLET | Freq: Two times a day (BID) | ORAL | Status: DC

## 2016-01-25 NOTE — Patient Instructions (Signed)
Medication Instructions:  Your physician has recommended you make the following change in your medication:  1. STOP Metoprolol Tartrate 2. START Carvedilol 12.5mg  take one tablet by mouth twice a day  Labwork: No new orders.   Testing/Procedures: Your physician has requested that you have an exercise stress myoview. For further information please visit [email or web address]. Please follow instruction sheet, as given.  Your physician has recommended that you wear a 48 hour holter monitor. Holter monitors are medical devices that record the heart's electrical activity. Doctors most often use these monitors to diagnose arrhythmias. Arrhythmias are problems with the speed or rhythm of the heartbeat. The monitor is a small, portable device. You can wear one while you do your normal daily activities. This is usually used to diagnose what is causing palpitations/syncope (passing out).  Follow-Up: Your physician recommends that you schedule a follow-up appointment in: 1 MONTH with Dr Fletcher Anon   Any Other Special Instructions Will Be Listed Below (If Applicable).     If you need a refill on your cardiac medications before your next appointment, please call your pharmacy.

## 2016-01-30 ENCOUNTER — Encounter: Payer: Self-pay | Admitting: Cardiovascular Disease

## 2016-01-30 DIAGNOSIS — R0609 Other forms of dyspnea: Secondary | ICD-10-CM

## 2016-01-30 DIAGNOSIS — I10 Essential (primary) hypertension: Secondary | ICD-10-CM | POA: Insufficient documentation

## 2016-01-30 DIAGNOSIS — R0600 Dyspnea, unspecified: Secondary | ICD-10-CM | POA: Insufficient documentation

## 2016-01-30 DIAGNOSIS — I493 Ventricular premature depolarization: Secondary | ICD-10-CM | POA: Insufficient documentation

## 2016-01-30 NOTE — Assessment & Plan Note (Signed)
We have to exclude ischemic heart disease as the culprit for her symptoms. Thus, I requested a nuclear stress test. Given her frequent PVCs, a treadmill stress test alone is not sufficient.

## 2016-01-30 NOTE — Assessment & Plan Note (Signed)
The patient is having frequent than symptomatic PVCs. I suspect that the bradycardia is likely due to his continuity due to frequent PVCs. I requested a 48-hour Holter monitor to quantify her PVCs. Thyroid function should be checked if not already done.

## 2016-01-30 NOTE — Assessment & Plan Note (Signed)
It is not ideal for her to hold such a large dose of metoprolol which might lead to beta blocker withdrawal. Her blood pressure continues to be elevated and thus I elected to switch metoprolol to carvedilol 12.5 mg twice daily. I asked her not to hold the dose.

## 2016-01-30 NOTE — Progress Notes (Signed)
HPI   this is a 70 year old female who was referred by Dr. Justin Mend for evaluation of bradycardia and exertional dyspnea. She has no previous cardiac history. She has known history of essential hypertension and has been on amlodipine and metoprolol for many years. She has known history of elevated triglyceride with borderline diabetes. She is not a smoker and has no family history of coronary artery disease. She has experienced increased exertional dyspnea over the last few months with fluctuation in her blood pressure and heart rate. She monitors her blood pressure and pulse at home and usually she does not take metoprolol if heart rate is less than 60. She is on 100 mg twice daily. She had few episodes where her heart rate was in the 40s. During that time she feels very weak and tired. She denies any chest pain but she does complain of arm and back pain. She hasn't been doing much activities and exercise lately due to feeling tired. She went to the emergency room recently for these complaints. Her labs were overall unremarkable except for slightly abnormal liver enzymes. EKG was remarkable for very frequent PVCs. The patient reports being under increased stress lately.  No Known Allergies   Current Outpatient Prescriptions on File Prior to Visit  Medication Sig Dispense Refill  . amLODipine (NORVASC) 5 MG tablet Take 5 mg by mouth daily.    Marland Kitchen aspirin 81 MG tablet Take 81 mg by mouth daily.    . Calcium Carbonate-Vitamin D (CALCIUM 600/VITAMIN D PO) Take 1 tablet by mouth daily.    . cholecalciferol (VITAMIN D) 1000 UNITS tablet Take 1,000 Units by mouth daily.    Marland Kitchen CRANBERRY EXTRACT PO Take 2 tablets by mouth daily.     . fenofibrate 160 MG tablet Take 160 mg by mouth daily.    . Omega-3 Fatty Acids (FISH OIL) 1000 MG CAPS Take 1,000 mg by mouth 2 (two) times daily.      No current facility-administered medications on file prior to visit.     Past Medical History  Diagnosis Date  .  Arthritis   . Hypertension   . Hyperlipidemia      Past Surgical History  Procedure Laterality Date  . Abdominal hysterectomy    . Tonsillectomy    . Rotator cuff repair       Family History  Problem Relation Age of Onset  . Cancer Mother     deceased 9  . Cancer Father     deceased 42     Social History   Social History  . Marital Status: Married    Spouse Name: N/A  . Number of Children: 1  . Years of Education: N/A   Occupational History  . Not on file.   Social History Main Topics  . Smoking status: Never Smoker   . Smokeless tobacco: Never Used  . Alcohol Use: No  . Drug Use: No  . Sexual Activity: Not on file   Other Topics Concern  . Not on file   Social History Narrative     ROS A 10 point review of system was performed. It is negative other than that mentioned in the history of present illness.   PHYSICAL EXAM   BP 160/70 mmHg  Pulse 60  Ht 5\' 3"  (1.6 m)  Wt 174 lb 3 oz (79.011 kg)  BMI 30.86 kg/m2 Constitutional: She is oriented to person, place, and time. She appears well-developed and well-nourished. No distress.  HENT: No nasal discharge.  Head: Normocephalic and atraumatic.  Eyes: Pupils are equal and round. No discharge.  Neck: Normal range of motion. Neck supple. No JVD present. No thyromegaly present.  Cardiovascular: Normal rate with premature beats, regular rhythm, normal heart sounds. Exam reveals no gallop and no friction rub. No murmur heard.  Pulmonary/Chest: Effort normal and breath sounds normal. No stridor. No respiratory distress. She has no wheezes. She has no rales. She exhibits no tenderness.  Abdominal: Soft. Bowel sounds are normal. She exhibits no distension. There is no tenderness. There is no rebound and no guarding.  Musculoskeletal: Normal range of motion. She exhibits no edema and no tenderness.  Neurological: She is alert and oriented to person, place, and time. Coordination normal.  Skin: Skin is warm and  dry. No rash noted. She is not diaphoretic. No erythema. No pallor.  Psychiatric: She has a normal mood and affect. Her behavior is normal. Judgment and thought content normal.     EKG: Recent EKG was reviewed which showed sinus rhythm with frequent PVCs.   ASSESSMENT AND PLAN

## 2016-02-01 ENCOUNTER — Telehealth: Payer: Self-pay | Admitting: Cardiovascular Disease

## 2016-02-01 ENCOUNTER — Encounter (HOSPITAL_COMMUNITY): Payer: Medicare Other

## 2016-02-01 DIAGNOSIS — I493 Ventricular premature depolarization: Secondary | ICD-10-CM

## 2016-02-01 DIAGNOSIS — R0609 Other forms of dyspnea: Principal | ICD-10-CM

## 2016-02-01 DIAGNOSIS — R0600 Dyspnea, unspecified: Secondary | ICD-10-CM

## 2016-02-01 NOTE — Telephone Encounter (Signed)
Pt states that she has been taking the Coreg as prescribed and has felt fine until yesterday. States yesterday she felt weak, had slight SOB and "heart feels like it's beating hard". Denies CP, lightheadedness or dizziness. Vitals at 3pm yesterday- 107/51, HR43.  Last night vitals up to 137/75, HR 68.  Vitals this morning 135/67, HR 63. Pt did not take Coreg last night due to HR being 43 yesterday. Pt has not taken Coreg this morning either. Pt states she feels a little jittery this morning. Pt states that she had these feelings occasionally prior to switching to Coreg so she is not sure where it may be coming from. Advised pt I will route to Dr Fletcher Anon and his nurse Ander Purpura for review, advisement and follow up.

## 2016-02-01 NOTE — Telephone Encounter (Signed)
New message      Pt c/o medication issue:  1. Name of Medication: carvedilol 2. How are you currently taking this medication (dosage and times per day)? 12.5mg  bid 3. Are you having a reaction (difficulty breathing--STAT)?  Pt states that it is hard to breathe  4. What is your medication issue?  Pt states that her bp is 107/51 HR 43 and she feels "jittery" and "weak".  She states it is "hard to breathe"   She started feeling this way yesterday afternoon.  Should she take her medication this am?

## 2016-02-02 NOTE — Telephone Encounter (Signed)
Left message on machine for pt to contact the office.  Pt's tests are scheduled for 02/08/2016.  We need to try and arrange earlier testing.

## 2016-02-02 NOTE — Telephone Encounter (Signed)
I think she is having a lot of PVCs. Did she get the Holter monitor done? How about other cardiac studies. We should expedite these. Continue same treatment for now.

## 2016-02-03 NOTE — Telephone Encounter (Signed)
Stress test is now scheduled for 02/04/16 at [facility] and holter to be applied in our office after stress test on 02/04/16. Pt has been notified.

## 2016-02-03 NOTE — Telephone Encounter (Signed)
Spoke with schedulers and Brantley Fling will need to be done at [facility] if needed before 02/08/16.  I spoke with pt and gave her information from Dr. Fletcher Anon and told her test would need to be done at [facility].    She reports heart rate is running 46-78.  She is concerned about taking Coreg twice daily and sometimes has only been taking once daily.  I instructed her Dr. Fletcher Anon would like her to continue same treatment plan and she should take Coreg twice daily.

## 2016-02-03 NOTE — Telephone Encounter (Signed)
Follow up     Pt is returning rn call

## 2016-02-04 ENCOUNTER — Encounter (HOSPITAL_COMMUNITY): Admission: RE | Admit: 2016-02-04 | Payer: Medicare Other | Source: Ambulatory Visit

## 2016-02-04 ENCOUNTER — Encounter (HOSPITAL_COMMUNITY)
Admission: RE | Admit: 2016-02-04 | Discharge: 2016-02-04 | Disposition: A | Discharge status: Home / Self Care | Payer: Medicare Other | Source: Ambulatory Visit | Attending: Cardiovascular Disease | Admitting: Cardiovascular Disease

## 2016-02-04 ENCOUNTER — Ambulatory Visit (INDEPENDENT_AMBULATORY_CARE_PROVIDER_SITE_OTHER): Payer: Medicare Other

## 2016-02-04 DIAGNOSIS — I493 Ventricular premature depolarization: Secondary | ICD-10-CM

## 2016-02-04 DIAGNOSIS — R0609 Other forms of dyspnea: Secondary | ICD-10-CM

## 2016-02-04 DIAGNOSIS — R0600 Dyspnea, unspecified: Secondary | ICD-10-CM

## 2016-02-04 LAB — NM MYOCAR MULTI W/SPECT W/WALL MOTION / EF
CHL CUP MPHR: 149 {beats}/min
CHL CUP NUCLEAR SDS: 5
CHL CUP NUCLEAR SSS: 13
CSEPEW: 1 METS
CSEPHR: 67 %
CSEPPHR: 101 {beats}/min
Exercise duration (min): 5 min
Exercise duration (sec): 15 s
LHR: 0.32
LV sys vol: 31 mL
LVDIAVOL: 72 mL
Rest HR: 67 {beats}/min
SRS: 8
TID: 1.3

## 2016-02-04 MED ORDER — REGADENOSON 0.4 MG/5ML IV SOLN
INTRAVENOUS | Status: AC
  Administered 2016-02-04: 0.4 mg via INTRAVENOUS
  Filled 2016-02-04: qty 5

## 2016-02-04 MED ORDER — REGADENOSON 0.4 MG/5ML IV SOLN
0.4000 mg | Freq: Once | INTRAVENOUS | Status: AC
  Administered 2016-02-04: 0.4 mg via INTRAVENOUS

## 2016-02-04 MED ORDER — TECHNETIUM TC 99M SESTAMIBI GENERIC - CARDIOLITE
30.0000 | Freq: Once | INTRAVENOUS | Status: AC | PRN
  Administered 2016-02-04: 30 via INTRAVENOUS

## 2016-02-04 MED ORDER — TECHNETIUM TC 99M SESTAMIBI GENERIC - CARDIOLITE
10.0000 | Freq: Once | INTRAVENOUS | Status: AC | PRN
  Administered 2016-02-04: 10 via INTRAVENOUS

## 2016-02-04 NOTE — Progress Notes (Signed)
GXT changed to Lexiscan due to MS Issues.  Lexiscan MV performed. No immediate complications.  Rosaria Ferries, PA-C 02/04/2016 10:10 AM Beeper [phone]

## 2016-02-04 NOTE — Progress Notes (Signed)
3 mins, Rhonda PA at bedside, pt reports some ongoing SOB, denies flushed feeling, chest pain, or abd discomfort

## 2016-02-04 NOTE — Progress Notes (Signed)
Pt presents with bilateral ankle pain that is chronic pain and Left hip that started last night. Pt also states she took her second dose of Coreg 12.5 mg last night at 2100 (3/2) This RN spoke with Suanne Marker PA and a verbal order was given to change patients treadmill test stress test to a [place] RN

## 2016-02-04 NOTE — Progress Notes (Signed)
1 min, Rhonda PA at bedside, pt reports some SOB and flushed. Pt denies chest pain or abd discomfort

## 2016-02-04 NOTE — Progress Notes (Addendum)
5 mins, Rhonda PA at bedside, pt reports SOB has subsided, pt continues to deny chest pain, abd discomfort. Test ended

## 2016-02-08 ENCOUNTER — Encounter (HOSPITAL_COMMUNITY): Payer: Medicare Other

## 2016-02-08 ENCOUNTER — Inpatient Hospital Stay (HOSPITAL_COMMUNITY): Admission: RE | Admit: 2016-02-08 | Payer: Medicare Other | Source: Ambulatory Visit

## 2016-02-17 ENCOUNTER — Telehealth: Payer: Self-pay | Admitting: Cardiovascular Disease

## 2016-02-17 DIAGNOSIS — I493 Ventricular premature depolarization: Secondary | ICD-10-CM

## 2016-02-17 MED ORDER — DILTIAZEM HCL ER COATED BEADS 120 MG PO CP24: 120.0000 mg | ORAL_CAPSULE | Freq: Every day | ORAL | Status: DC

## 2016-02-17 NOTE — Telephone Encounter (Signed)
-----   Message from Wellington Hampshire, MD sent at 02/14/2016  4:16 PM EDT ----- Inform patient that Holter monitor showed significant amount of PVCs. I recommend switching Amlodipine to Diltiazem ER 120 mg once daily. Schedule an echocardiogram and follow up after that with me.

## 2016-02-17 NOTE — Telephone Encounter (Signed)
Spoke with patient. She will stop Amlodipine and tomorrow will start Diltiazem ER 120 mg.   Echo scheduled for tomorrow 3/17 at 3:00 pm Will keep her appointment with Dr. Fletcher Anon at [facility] on 02/22/16.

## 2016-02-17 NOTE — Telephone Encounter (Signed)
Follow Up: ° ° ° ° °Returning call from yesterday. °

## 2016-02-18 ENCOUNTER — Other Ambulatory Visit: Payer: Self-pay

## 2016-02-18 ENCOUNTER — Ambulatory Visit (HOSPITAL_COMMUNITY): Payer: Medicare Other | Attending: Internal Medicine

## 2016-02-18 DIAGNOSIS — I119 Hypertensive heart disease without heart failure: Secondary | ICD-10-CM | POA: Insufficient documentation

## 2016-02-18 DIAGNOSIS — E785 Hyperlipidemia, unspecified: Secondary | ICD-10-CM | POA: Diagnosis not present

## 2016-02-18 DIAGNOSIS — I071 Rheumatic tricuspid insufficiency: Secondary | ICD-10-CM | POA: Diagnosis not present

## 2016-02-18 DIAGNOSIS — I493 Ventricular premature depolarization: Secondary | ICD-10-CM | POA: Diagnosis not present

## 2016-02-22 ENCOUNTER — Ambulatory Visit (INDEPENDENT_AMBULATORY_CARE_PROVIDER_SITE_OTHER): Payer: Medicare Other | Admitting: Cardiovascular Disease

## 2016-02-22 ENCOUNTER — Encounter: Payer: Self-pay | Admitting: Cardiovascular Disease

## 2016-02-22 VITALS — BP 164/86 | HR 68 | Ht 63.0 in | Wt 176.7 lb

## 2016-02-22 DIAGNOSIS — I493 Ventricular premature depolarization: Secondary | ICD-10-CM

## 2016-02-22 NOTE — Patient Instructions (Signed)
Medication Instructions: Continue same medications.   Labwork: None.   Procedures/Testing: None.   Follow-Up: 6 months with Dr. Arida.   Any Additional Special Instructions Will Be Listed Below (If Applicable).     If you need a refill on your cardiac medications before your next appointment, please call your pharmacy.   

## 2016-02-22 NOTE — Progress Notes (Signed)
Cardiology Office Note   Date:  02/22/2016   ID:  Nancy Yu, DOB 1946-08-27, MRN [id number]  PCP:  Jonathon Bellows, MD  Cardiologist:   Kathlyn Sacramento, MD   Chief Complaint  Patient presents with  . Follow-up    pt states no chest pain and no edema  . Shortness of Breath    every once in a while       History of Present Illness: Nancy Yu is a 70 y.o. female who presents for a follow up visit regarding symptomatic PVCs.  She has known history of essential hypertension and has been on amlodipine and metoprolol for many years. She has known history of elevated triglyceride with borderline diabetes. She is not a smoker and has no family history of coronary artery disease. She was seen recently for increased exertional dyspnea with fluctuation in her blood pressure and heart rate.  She had bradycardia on her BP monitor which was thought to be due ventricular bigeminy.  During last visit, I switched Metoprolol to Carvedilol. She underwent a nuclear stress test which was normal.  A Holter monitor showed 7000 PVC (5%). Echo was unremarkable.  I switched Amlodipine to Diltiazem ER 120 mg once daily. Since that time, she had significant improvement in her symptoms.    Past Medical History  Diagnosis Date  . Arthritis   . Hypertension   . Hyperlipidemia     Past Surgical History  Procedure Laterality Date  . Abdominal hysterectomy    . Tonsillectomy    . Rotator cuff repair       Current Outpatient Prescriptions  Medication Sig Dispense Refill  . aspirin 81 MG tablet Take 81 mg by mouth daily.    . Calcium Carbonate-Vitamin D (CALCIUM 600/VITAMIN D PO) Take 1 tablet by mouth daily.    . carvedilol (COREG) 12.5 MG tablet Take 1 tablet (12.5 mg total) by mouth 2 (two) times daily. 60 tablet 3  . cholecalciferol (VITAMIN D) 1000 UNITS tablet Take 1,000 Units by mouth daily.    Marland Kitchen CRANBERRY EXTRACT PO Take 2 tablets by mouth daily.     Marland Kitchen diltiazem (CARDIZEM CD)  120 MG 24 hr capsule Take 1 capsule (120 mg total) by mouth daily. 90 capsule 3  . fenofibrate 160 MG tablet Take 160 mg by mouth daily.    . fluticasone (FLONASE) 50 MCG/ACT nasal spray Place 1 spray into both nostrils as directed.  11  . Omega-3 Fatty Acids (FISH OIL) 1000 MG CAPS Take 1,000 mg by mouth 2 (two) times daily.      No current facility-administered medications for this visit.    Allergies:   Review of patient's allergies indicates no known allergies.    Social History:  The patient  reports that she has never smoked. She has never used smokeless tobacco. She reports that she does not drink alcohol or use illicit drugs.   Family History:  The patient's family history includes Cancer in her father and mother.    ROS:  Please see the history of present illness.   Otherwise, review of systems are positive for none.   All other systems are reviewed and negative.    PHYSICAL EXAM: VS:  BP 164/86 mmHg  Pulse 68  Ht 5\' 3"  (1.6 m)  Wt 176 lb 11.2 oz (80.151 kg)  BMI 31.31 kg/m2 , BMI Body mass index is 31.31 kg/(m^2). GEN: Well nourished, well developed, in no acute distress HEENT: normal Neck: no JVD,  carotid bruits, or masses Cardiac: RRR; no murmurs, rubs, or gallops,no edema  Respiratory:  clear to auscultation bilaterally, normal work of breathing GI: soft, nontender, nondistended, + BS MS: no deformity or atrophy Skin: warm and dry, no rash Neuro:  Strength and sensation are intact Psych: euthymic mood, full affect   EKG:  EKG is not ordered today.    Recent Labs: 01/05/2016: ALT 67*; BUN 17; Creatinine, Ser 0.79; Hemoglobin 16.9*; Platelets 164; Potassium 3.8; Sodium 140    Lipid Panel No results found for: CHOL, TRIG, HDL, CHOLHDL, VLDL, LDLCALC, LDLDIRECT    Wt Readings from Last 3 Encounters:  02/22/16 176 lb 11.2 oz (80.151 kg)  01/25/16 174 lb 3 oz (79.011 kg)  03/07/14 168 lb (76.204 kg)        ASSESSMENT AND PLAN:  1. Symptomatic PVCs:  Cardiac workup showed no evidence of ischemic or structural heart disease. She was under stress which might have contributed. Holter monitor showed 5% PVCs. Symptoms improved significantly after switching her antihypertensive medications to carvedilol and diltiazem. By physical exam, she has no premature beats at all. She is also feeling better. Continue current medications. Her blood pressure at home has been optimal and thus I made no changes today. If blood pressure starts going up, we can consider increasing the dose of carvedilol.    2. Essential hypertension: Blood pressure is well controlled at home and mildly elevated today but she was rushing to her appointment. Continue to monitor.     Disposition:   FU with me in 6 months  Signed,  Kathlyn Sacramento, MD  02/22/2016 9:29 AM    [facility]

## 2016-05-20 ENCOUNTER — Other Ambulatory Visit: Payer: Self-pay | Admitting: Cardiovascular Disease

## 2016-08-08 ENCOUNTER — Other Ambulatory Visit: Payer: Self-pay | Admitting: Family Medicine

## 2016-08-08 DIAGNOSIS — Z1231 Encounter for screening mammogram for malignant neoplasm of breast: Secondary | ICD-10-CM

## 2016-08-22 ENCOUNTER — Ambulatory Visit (INDEPENDENT_AMBULATORY_CARE_PROVIDER_SITE_OTHER): Payer: Medicare Other | Admitting: Cardiovascular Disease

## 2016-08-22 VITALS — BP 120/80 | HR 54 | Ht 63.0 in | Wt 168.0 lb

## 2016-08-22 DIAGNOSIS — I493 Ventricular premature depolarization: Secondary | ICD-10-CM

## 2016-08-22 DIAGNOSIS — I10 Essential (primary) hypertension: Secondary | ICD-10-CM

## 2016-08-22 MED ORDER — DILTIAZEM HCL ER COATED BEADS 120 MG PO CP24: 120.0000 mg | ORAL_CAPSULE | Freq: Every day | ORAL | 3 refills | Status: DC

## 2016-08-22 MED ORDER — CARVEDILOL 12.5 MG PO TABS: ORAL_TABLET | ORAL | 3 refills | Status: DC

## 2016-08-22 NOTE — Patient Instructions (Signed)

## 2016-08-22 NOTE — Progress Notes (Signed)
Cardiology Office Note   Date:  08/22/2016   ID:  Nancy Yu, DOB 1945/12/20, MRN [id number]  PCP:  Jonathon Bellows, MD  Cardiologist:   Kathlyn Sacramento, MD   No chief complaint on file.     History of Present Illness: Nancy Yu is a 70 y.o. female who presents for a follow up visit regarding symptomatic PVCs.  She has known history of essential hypertension and has been on amlodipine and metoprolol for many years. She has known history of elevated triglyceride with borderline diabetes. She is not a smoker and has no family history of coronary artery disease. She had cardiac work up in 02/2016 which included:  A normal nuclear stress test .A Holter monitor in March showed 7000 PVC (5%). Echo was unremarkable.  I switched Amlodipine to Diltiazem ER 120 mg once daily.  She describes mild dizziness mostly at night. No palpitations or tachycardia. No chest pain or shortness of breath. Her only complaint is mild constipation since she was started on diltiazem.   Past Medical History:  Diagnosis Date  . Arthritis   . Hyperlipidemia   . Hypertension     Past Surgical History:  Procedure Laterality Date  . ABDOMINAL HYSTERECTOMY    . ROTATOR CUFF REPAIR    . TONSILLECTOMY       Current Outpatient Prescriptions  Medication Sig Dispense Refill  . aspirin 81 MG tablet Take 81 mg by mouth daily.    . Calcium Carbonate-Vitamin D (CALCIUM 600/VITAMIN D PO) Take 1 tablet by mouth daily.    . carvedilol (COREG) 12.5 MG tablet TAKE 1 TABLET (12.5 MG TOTAL) BY MOUTH 2 (TWO) TIMES DAILY. 60 tablet 3  . cholecalciferol (VITAMIN D) 1000 UNITS tablet Take 1,000 Units by mouth daily.    Marland Kitchen CRANBERRY EXTRACT PO Take 2 tablets by mouth daily.     Marland Kitchen diltiazem (CARDIZEM CD) 120 MG 24 hr capsule Take 1 capsule (120 mg total) by mouth daily. 90 capsule 3  . fenofibrate 160 MG tablet Take 160 mg by mouth daily.    . fluticasone (FLONASE) 50 MCG/ACT nasal spray Place 1 spray into both  nostrils as directed.  11  . Omega-3 Fatty Acids (FISH OIL) 1000 MG CAPS Take 1,000 mg by mouth 2 (two) times daily.      No current facility-administered medications for this visit.     Allergies:   Review of patient's allergies indicates no known allergies.    Social History:  The patient  reports that she has never smoked. She has never used smokeless tobacco. She reports that she does not drink alcohol or use drugs.   Family History:  The patient's family history includes Cancer in her father and mother.    ROS:  Please see the history of present illness.   Otherwise, review of systems are positive for none.   All other systems are reviewed and negative.    PHYSICAL EXAM: VS:  BP 120/80   Pulse (!) 54   Ht 5\' 3"  (1.6 m)   Wt 168 lb (76.2 kg)   BMI 29.76 kg/m  , BMI Body mass index is 29.76 kg/m. GEN: Well nourished, well developed, in no acute distress  HEENT: normal  Neck: no JVD, carotid bruits, or masses Cardiac: RRR; no murmurs, rubs, or gallops,no edema  Respiratory:  clear to auscultation bilaterally, normal work of breathing GI: soft, nontender, nondistended, + BS MS: no deformity or atrophy  Skin: warm and dry, no  rash Neuro:  Strength and sensation are intact Psych: euthymic mood, full affect   EKG:  EKG is not ordered today.    Recent Labs: 01/05/2016: ALT 67; BUN 17; Creatinine, Ser 0.79; Hemoglobin 16.9; Platelets 164; Potassium 3.8; Sodium 140    Lipid Panel No results found for: CHOL, TRIG, HDL, CHOLHDL, VLDL, LDLCALC, LDLDIRECT    Wt Readings from Last 3 Encounters:  08/22/16 168 lb (76.2 kg)  02/22/16 176 lb 11.2 oz (80.2 kg)  01/25/16 174 lb 3 oz (79 kg)        ASSESSMENT AND PLAN:  1. Symptomatic PVCs: Cardiac workup showed no evidence of ischemic or structural heart disease.  Symptoms resolved with carvedilol and diltiazem. No evidence of premature beats by physical exam. Constipation is likely due to diltiazem. Symptoms are still mild  overall. Continue to monitor and if there is worsening, we can consider stopping diltiazem.   2. Essential hypertension: Blood pressure is well controlled on current medications  Disposition:   FU with me in 12 months  Signed,  Kathlyn Sacramento, MD  08/22/2016 8:21 AM    [facility]

## 2016-09-15 ENCOUNTER — Ambulatory Visit
Admission: RE | Admit: 2016-09-15 | Discharge: 2016-09-15 | Disposition: A | Discharge status: Home / Self Care | Payer: Medicare Other | Source: Ambulatory Visit | Attending: Family Medicine | Admitting: Family Medicine

## 2016-09-15 DIAGNOSIS — Z1231 Encounter for screening mammogram for malignant neoplasm of breast: Secondary | ICD-10-CM

## 2016-12-11 ENCOUNTER — Other Ambulatory Visit: Payer: Self-pay

## 2016-12-11 DIAGNOSIS — I493 Ventricular premature depolarization: Secondary | ICD-10-CM

## 2016-12-11 MED ORDER — DILTIAZEM HCL ER COATED BEADS 120 MG PO CP24
120.0000 mg | ORAL_CAPSULE | Freq: Every day | ORAL | 3 refills | Status: DC
Start: 2016-12-11 — End: 2018-03-01

## 2017-01-09 DIAGNOSIS — H26491 Other secondary cataract, right eye: Secondary | ICD-10-CM | POA: Diagnosis not present

## 2017-01-09 DIAGNOSIS — H5203 Hypermetropia, bilateral: Secondary | ICD-10-CM | POA: Diagnosis not present

## 2017-01-09 DIAGNOSIS — H52223 Regular astigmatism, bilateral: Secondary | ICD-10-CM | POA: Diagnosis not present

## 2017-01-09 DIAGNOSIS — H43392 Other vitreous opacities, left eye: Secondary | ICD-10-CM | POA: Diagnosis not present

## 2017-01-12 DIAGNOSIS — H26491 Other secondary cataract, right eye: Secondary | ICD-10-CM | POA: Diagnosis not present

## 2017-01-12 DIAGNOSIS — H18413 Arcus senilis, bilateral: Secondary | ICD-10-CM | POA: Diagnosis not present

## 2017-01-12 DIAGNOSIS — Z961 Presence of intraocular lens: Secondary | ICD-10-CM | POA: Diagnosis not present

## 2017-01-12 DIAGNOSIS — H25043 Posterior subcapsular polar age-related cataract, bilateral: Secondary | ICD-10-CM | POA: Diagnosis not present

## 2017-01-16 DIAGNOSIS — L821 Other seborrheic keratosis: Secondary | ICD-10-CM | POA: Diagnosis not present

## 2017-01-16 DIAGNOSIS — C44329 Squamous cell carcinoma of skin of other parts of face: Secondary | ICD-10-CM | POA: Diagnosis not present

## 2017-01-16 DIAGNOSIS — L570 Actinic keratosis: Secondary | ICD-10-CM | POA: Diagnosis not present

## 2017-01-16 DIAGNOSIS — L720 Epidermal cyst: Secondary | ICD-10-CM | POA: Diagnosis not present

## 2017-01-16 DIAGNOSIS — D485 Neoplasm of uncertain behavior of skin: Secondary | ICD-10-CM | POA: Diagnosis not present

## 2017-01-19 DIAGNOSIS — H5201 Hypermetropia, right eye: Secondary | ICD-10-CM | POA: Diagnosis not present

## 2017-01-19 DIAGNOSIS — H26491 Other secondary cataract, right eye: Secondary | ICD-10-CM | POA: Diagnosis not present

## 2017-01-19 DIAGNOSIS — H52221 Regular astigmatism, right eye: Secondary | ICD-10-CM | POA: Diagnosis not present

## 2017-01-19 DIAGNOSIS — Z9841 Cataract extraction status, right eye: Secondary | ICD-10-CM | POA: Diagnosis not present

## 2017-01-31 DIAGNOSIS — Z85828 Personal history of other malignant neoplasm of skin: Secondary | ICD-10-CM | POA: Diagnosis not present

## 2017-01-31 DIAGNOSIS — C44329 Squamous cell carcinoma of skin of other parts of face: Secondary | ICD-10-CM | POA: Diagnosis not present

## 2017-03-09 DIAGNOSIS — I10 Essential (primary) hypertension: Secondary | ICD-10-CM | POA: Diagnosis not present

## 2017-03-09 DIAGNOSIS — M25552 Pain in left hip: Secondary | ICD-10-CM | POA: Diagnosis not present

## 2017-03-09 DIAGNOSIS — E119 Type 2 diabetes mellitus without complications: Secondary | ICD-10-CM | POA: Diagnosis not present

## 2017-03-09 DIAGNOSIS — C4492 Squamous cell carcinoma of skin, unspecified: Secondary | ICD-10-CM | POA: Diagnosis not present

## 2017-03-09 DIAGNOSIS — E782 Mixed hyperlipidemia: Secondary | ICD-10-CM | POA: Diagnosis not present

## 2017-03-26 DIAGNOSIS — Z79899 Other long term (current) drug therapy: Secondary | ICD-10-CM | POA: Diagnosis not present

## 2017-03-26 DIAGNOSIS — Z79891 Long term (current) use of opiate analgesic: Secondary | ICD-10-CM | POA: Diagnosis not present

## 2017-03-26 DIAGNOSIS — Z0000 Encounter for general adult medical examination without abnormal findings: Secondary | ICD-10-CM | POA: Diagnosis not present

## 2017-03-26 DIAGNOSIS — I10 Essential (primary) hypertension: Secondary | ICD-10-CM | POA: Diagnosis not present

## 2017-03-26 DIAGNOSIS — K649 Unspecified hemorrhoids: Secondary | ICD-10-CM | POA: Diagnosis not present

## 2017-03-26 DIAGNOSIS — H9319 Tinnitus, unspecified ear: Secondary | ICD-10-CM | POA: Diagnosis not present

## 2017-03-26 DIAGNOSIS — E785 Hyperlipidemia, unspecified: Secondary | ICD-10-CM | POA: Diagnosis not present

## 2017-03-26 DIAGNOSIS — M5432 Sciatica, left side: Secondary | ICD-10-CM | POA: Diagnosis not present

## 2017-03-26 DIAGNOSIS — N819 Female genital prolapse, unspecified: Secondary | ICD-10-CM | POA: Diagnosis not present

## 2017-04-18 DIAGNOSIS — Z85828 Personal history of other malignant neoplasm of skin: Secondary | ICD-10-CM | POA: Diagnosis not present

## 2017-04-18 DIAGNOSIS — L821 Other seborrheic keratosis: Secondary | ICD-10-CM | POA: Diagnosis not present

## 2017-04-18 DIAGNOSIS — D1801 Hemangioma of skin and subcutaneous tissue: Secondary | ICD-10-CM | POA: Diagnosis not present

## 2017-04-18 DIAGNOSIS — L720 Epidermal cyst: Secondary | ICD-10-CM | POA: Diagnosis not present

## 2017-04-18 DIAGNOSIS — L817 Pigmented purpuric dermatosis: Secondary | ICD-10-CM | POA: Diagnosis not present

## 2017-04-18 DIAGNOSIS — L570 Actinic keratosis: Secondary | ICD-10-CM | POA: Diagnosis not present

## 2017-04-23 DIAGNOSIS — N8110 Cystocele, unspecified: Secondary | ICD-10-CM | POA: Diagnosis not present

## 2017-04-23 DIAGNOSIS — N816 Rectocele: Secondary | ICD-10-CM | POA: Diagnosis not present

## 2017-08-07 ENCOUNTER — Other Ambulatory Visit: Payer: Self-pay | Admitting: Family Medicine

## 2017-08-07 DIAGNOSIS — Z1231 Encounter for screening mammogram for malignant neoplasm of breast: Secondary | ICD-10-CM

## 2017-08-21 ENCOUNTER — Ambulatory Visit (INDEPENDENT_AMBULATORY_CARE_PROVIDER_SITE_OTHER): Payer: Medicare HMO | Admitting: Cardiovascular Disease

## 2017-08-21 ENCOUNTER — Encounter: Payer: Self-pay | Admitting: Cardiovascular Disease

## 2017-08-21 VITALS — BP 170/80 | HR 60 | Ht 62.0 in | Wt 169.0 lb

## 2017-08-21 DIAGNOSIS — I493 Ventricular premature depolarization: Secondary | ICD-10-CM | POA: Diagnosis not present

## 2017-08-21 DIAGNOSIS — I10 Essential (primary) hypertension: Secondary | ICD-10-CM

## 2017-08-21 NOTE — Patient Instructions (Signed)
Your physician recommends that you schedule a follow-up appointment in: AS NEEDED  

## 2017-08-21 NOTE — Progress Notes (Signed)
Cardiology Office Note   Date:  08/21/2017   ID:  Nancy Yu, DOB 10-Nov-1946, MRN [id number]  PCP:  Maurice Small, MD  Cardiologist:   Kathlyn Sacramento, MD   No chief complaint on file.     History of Present Illness: Nancy Yu is a 71 y.o. female who presents for a follow up visit regarding symptomatic PVCs.  She has known history of essential hypertension. She has known history of elevated triglyceride with borderline diabetes. She is not a smoker and has no family history of coronary artery disease. She had cardiac work up in 02/2016 which included:  A normal nuclear stress test . A Holter monitor in March showed 7000 PVC (5%). Echo was unremarkable.  She was treated with diltiazem with improvement. She has been doing well and denies any chest pain, shortness of breath or palpitations. She complains of lower back pain especially in the morning but not with walking. Distal pulses are palpable.    Past Medical History:  Diagnosis Date  . Arthritis   . Hyperlipidemia   . Hypertension     Past Surgical History:  Procedure Laterality Date  . ABDOMINAL HYSTERECTOMY    . ROTATOR CUFF REPAIR    . TONSILLECTOMY       Current Outpatient Prescriptions  Medication Sig Dispense Refill  . aspirin 81 MG tablet Take 81 mg by mouth daily.    . Calcium Carbonate-Vitamin D (CALCIUM 600/VITAMIN D PO) Take 1 tablet by mouth daily.    . carvedilol (COREG) 12.5 MG tablet TAKE 1 TABLET (12.5 MG TOTAL) BY MOUTH 2 (TWO) TIMES DAILY. 180 tablet 3  . cholecalciferol (VITAMIN D) 1000 UNITS tablet Take 1,000 Units by mouth daily.    Marland Kitchen diltiazem (CARDIZEM CD) 120 MG 24 hr capsule Take 1 capsule (120 mg total) by mouth daily. 90 capsule 3  . fenofibrate 160 MG tablet Take 160 mg by mouth daily.    . fluticasone (FLONASE) 50 MCG/ACT nasal spray Place 1 spray into both nostrils as directed.  11  . Omega-3 Fatty Acids (FISH OIL) 1000 MG CAPS Take 1,000 mg by mouth 2 (two) times daily.       No current facility-administered medications for this visit.     Allergies:   Patient has no known allergies.    Social History:  The patient  reports that she has never smoked. She has never used smokeless tobacco. She reports that she does not drink alcohol or use drugs.   Family History:  The patient's family history includes Cancer in her father and mother.    ROS:  Please see the history of present illness.   Otherwise, review of systems are positive for none.   All other systems are reviewed and negative.    PHYSICAL EXAM: VS:  BP (!) 170/80   Pulse 60   Ht 5\' 2"  (1.575 m)   Wt 169 lb (76.7 kg)   BMI 30.91 kg/m  , BMI Body mass index is 30.91 kg/m. GEN: Well nourished, well developed, in no acute distress  HEENT: normal  Neck: no JVD, carotid bruits, or masses Cardiac: RRR; no murmurs, rubs, or gallops,no edema  Respiratory:  clear to auscultation bilaterally, normal work of breathing GI: soft, nontender, nondistended, + BS MS: no deformity or atrophy  Skin: warm and dry, no rash Neuro:  Strength and sensation are intact Psych: euthymic mood, full affect   EKG:  EKG is ordered today. EKG showed sinus rhythm with a  few PACs. LVH with repolarization abnormalities. No   Recent Labs: No results found for requested labs within last 8760 hours.    Lipid Panel No results found for: CHOL, TRIG, HDL, CHOLHDL, VLDL, LDLCALC, LDLDIRECT    Wt Readings from Last 3 Encounters:  08/21/17 169 lb (76.7 kg)  08/22/16 168 lb (76.2 kg)  02/22/16 176 lb 11.2 oz (80.2 kg)        ASSESSMENT AND PLAN:  1. Symptomatic PVCs: Cardiac workup showed no evidence of ischemic or structural heart disease.  No evidence of recurrent PVCs since she was started on diltiazem. I made no changes in her medications   2. Essential hypertension: Blood pressure is elevated today but she attributes that to being stuck in traffic. She does report that her blood pressure frequently runs above  794 systolic. She has a follow-up appointment with her primary care physician. If blood pressure continues to be elevated, an ARB or ACE inhibitor can be considered.  Disposition:   Given stability of cardiac status, patient can follow-up with me as needed. Continue regular follow-up with PCP.  Signed,  Kathlyn Sacramento, MD  08/21/2017 8:32 AM    [facility]

## 2017-08-29 DIAGNOSIS — N952 Postmenopausal atrophic vaginitis: Secondary | ICD-10-CM | POA: Diagnosis not present

## 2017-08-29 DIAGNOSIS — R152 Fecal urgency: Secondary | ICD-10-CM | POA: Diagnosis not present

## 2017-08-29 DIAGNOSIS — N993 Prolapse of vaginal vault after hysterectomy: Secondary | ICD-10-CM | POA: Diagnosis not present

## 2017-08-29 DIAGNOSIS — N3941 Urge incontinence: Secondary | ICD-10-CM | POA: Diagnosis not present

## 2017-08-29 DIAGNOSIS — N8110 Cystocele, unspecified: Secondary | ICD-10-CM | POA: Diagnosis not present

## 2017-08-29 DIAGNOSIS — N3281 Overactive bladder: Secondary | ICD-10-CM | POA: Diagnosis not present

## 2017-08-29 DIAGNOSIS — R159 Full incontinence of feces: Secondary | ICD-10-CM | POA: Diagnosis not present

## 2017-09-07 ENCOUNTER — Other Ambulatory Visit: Payer: Self-pay | Admitting: Cardiovascular Disease

## 2017-09-07 DIAGNOSIS — I493 Ventricular premature depolarization: Secondary | ICD-10-CM

## 2017-09-17 ENCOUNTER — Ambulatory Visit
Admission: RE | Admit: 2017-09-17 | Discharge: 2017-09-17 | Disposition: A | Discharge status: Home / Self Care | Payer: Medicare HMO | Source: Ambulatory Visit | Attending: Family Medicine | Admitting: Family Medicine

## 2017-09-17 DIAGNOSIS — Z1231 Encounter for screening mammogram for malignant neoplasm of breast: Secondary | ICD-10-CM

## 2017-10-10 DIAGNOSIS — N3941 Urge incontinence: Secondary | ICD-10-CM | POA: Diagnosis not present

## 2017-10-10 DIAGNOSIS — N3281 Overactive bladder: Secondary | ICD-10-CM | POA: Diagnosis not present

## 2017-10-10 DIAGNOSIS — K5903 Drug induced constipation: Secondary | ICD-10-CM | POA: Diagnosis not present

## 2017-10-10 DIAGNOSIS — N816 Rectocele: Secondary | ICD-10-CM | POA: Diagnosis not present

## 2017-10-10 DIAGNOSIS — N952 Postmenopausal atrophic vaginitis: Secondary | ICD-10-CM | POA: Diagnosis not present

## 2017-10-17 DIAGNOSIS — E119 Type 2 diabetes mellitus without complications: Secondary | ICD-10-CM | POA: Diagnosis not present

## 2017-10-17 DIAGNOSIS — E782 Mixed hyperlipidemia: Secondary | ICD-10-CM | POA: Diagnosis not present

## 2017-10-17 DIAGNOSIS — Z23 Encounter for immunization: Secondary | ICD-10-CM | POA: Diagnosis not present

## 2017-10-17 DIAGNOSIS — I10 Essential (primary) hypertension: Secondary | ICD-10-CM | POA: Diagnosis not present

## 2017-10-17 DIAGNOSIS — Z0000 Encounter for general adult medical examination without abnormal findings: Secondary | ICD-10-CM | POA: Diagnosis not present

## 2017-11-08 IMAGING — CR DG CHEST 2V
2 series · 2 of 2 positions shown · non-contrast
Comparison: None.

CLINICAL DATA: Shortness of breath, mid chest pain, intermittent
right arm pain and weakness for 1 week.

EXAM:
CHEST  2 VIEW

[chest pa]
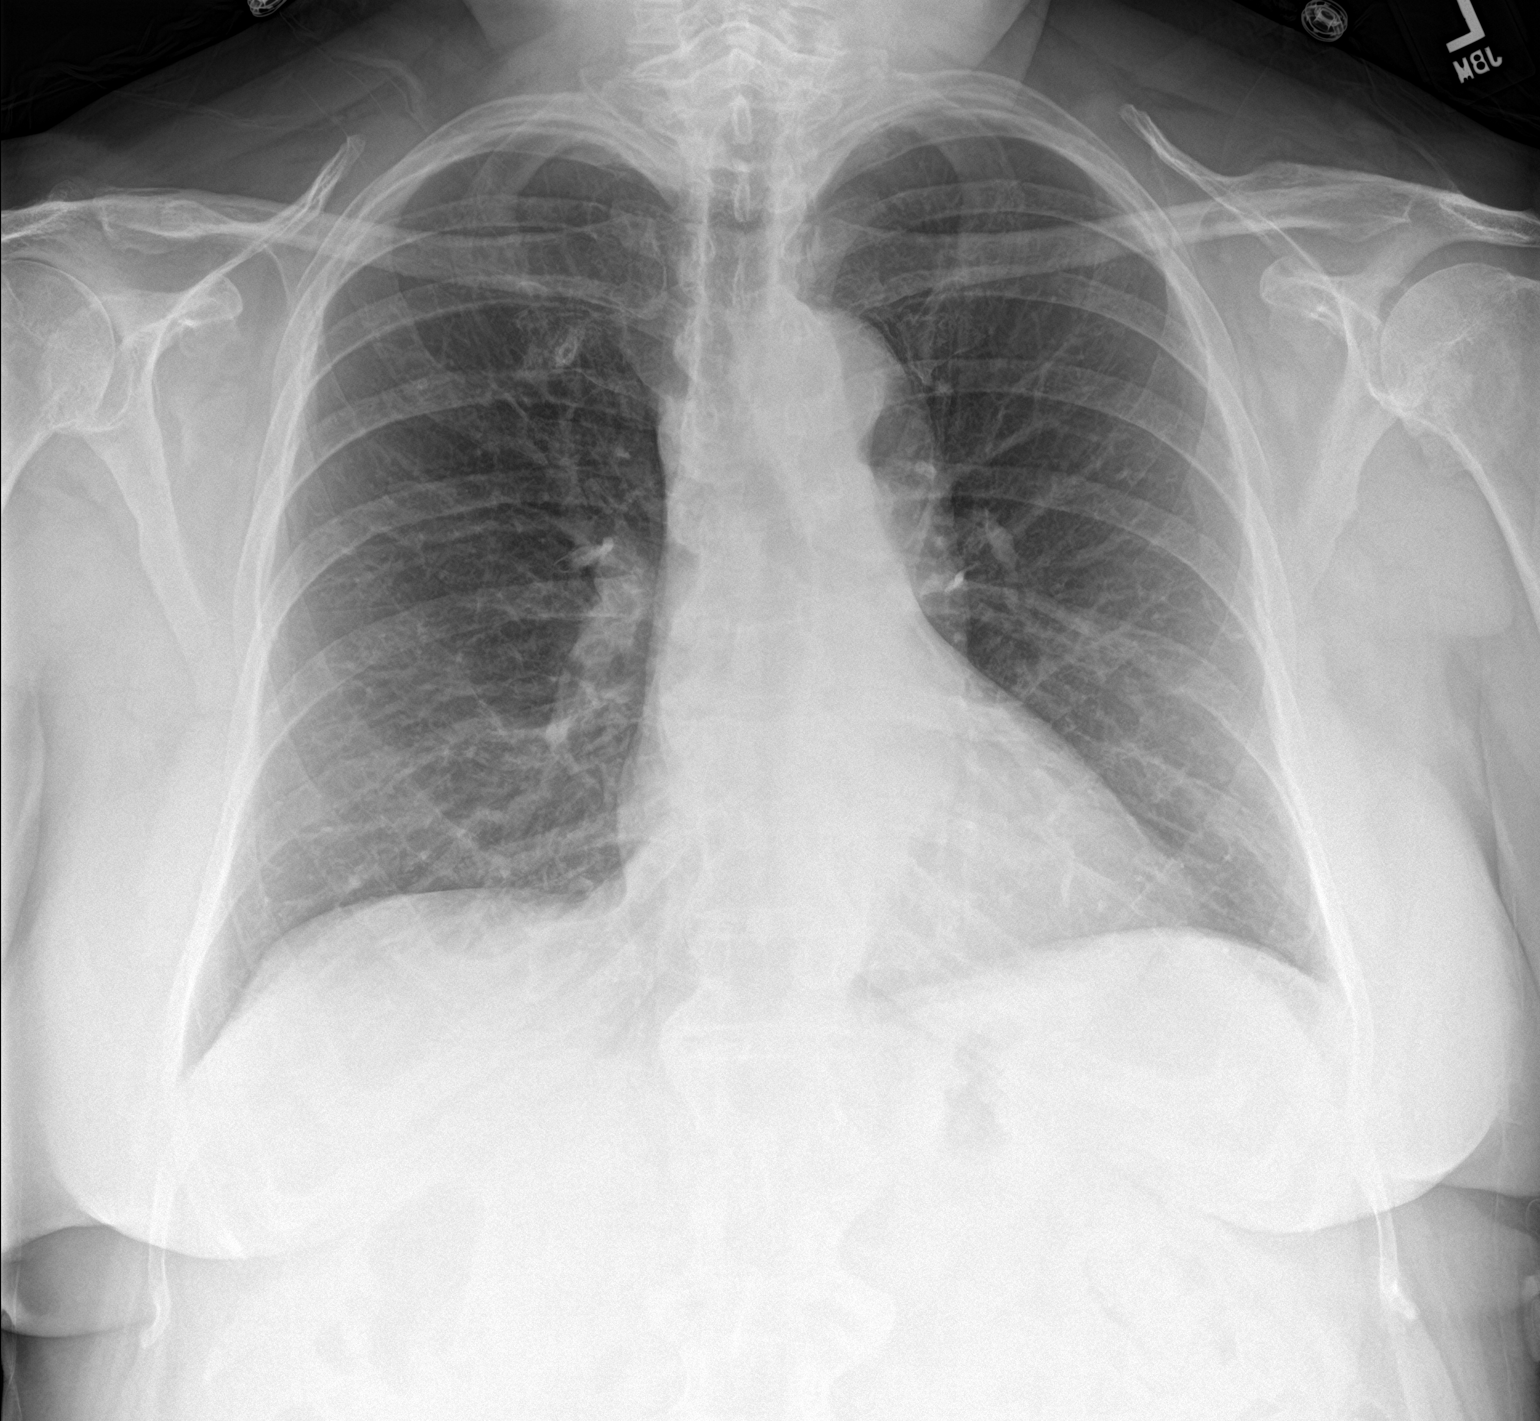

[chest lat]
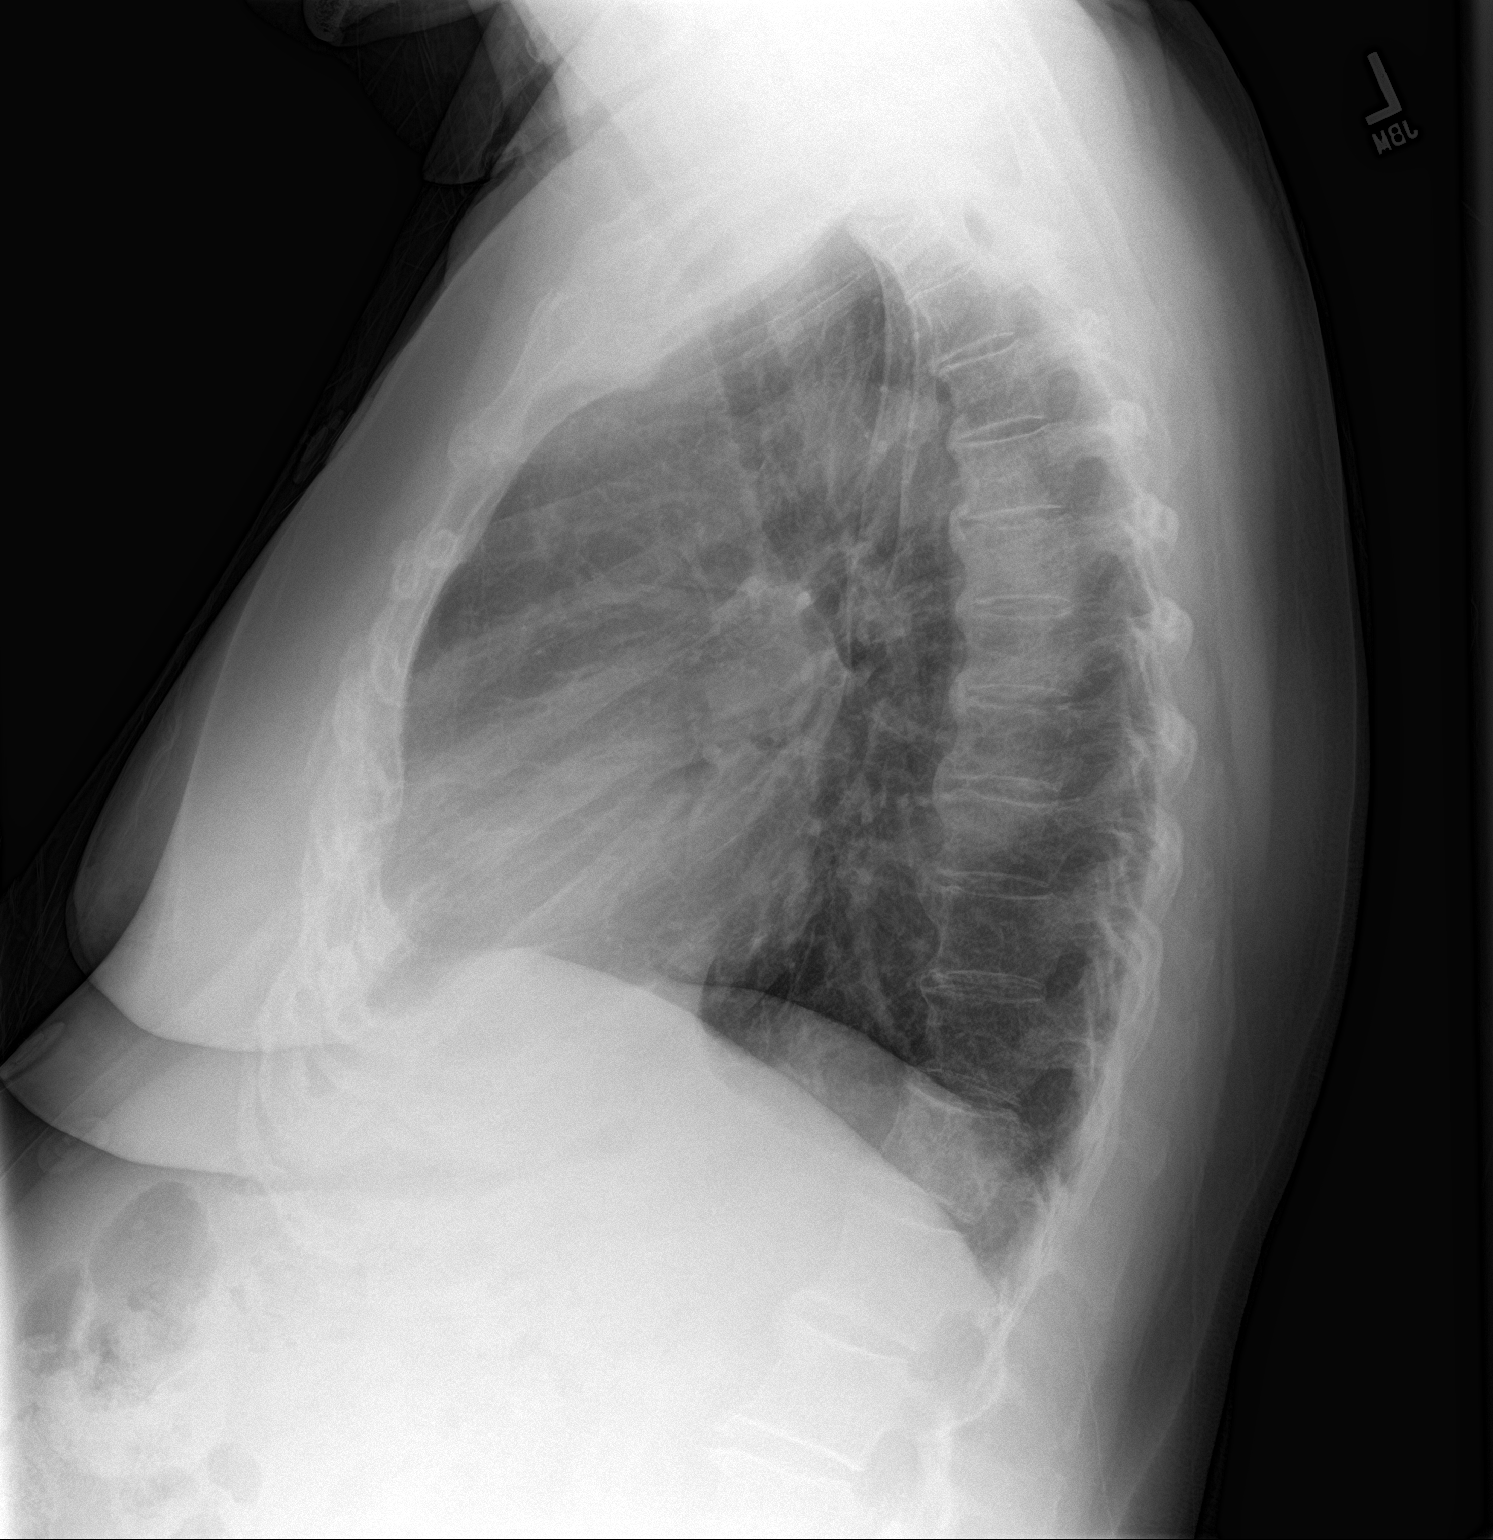

[2 of 2 positions shown; findings below may reference images not displayed]

FINDINGS: Heart is normal in size. There is tortuosity of the thoracic aorta.
The lungs are clear. Pulmonary vasculature is normal. No
consolidation, pleural effusion, or pneumothorax. No acute osseous
abnormalities are seen. Findings consistent with diffuse idiopathic
skeletal hyperostosis unchanged compared to a thoracic spine
radiograph 01/03/2012.
IMPRESSION: No acute pulmonary process.

## 2017-11-21 DIAGNOSIS — N3941 Urge incontinence: Secondary | ICD-10-CM | POA: Diagnosis not present

## 2017-11-21 DIAGNOSIS — N3281 Overactive bladder: Secondary | ICD-10-CM | POA: Diagnosis not present

## 2017-11-21 DIAGNOSIS — K5909 Other constipation: Secondary | ICD-10-CM | POA: Diagnosis not present

## 2017-11-21 DIAGNOSIS — N816 Rectocele: Secondary | ICD-10-CM | POA: Diagnosis not present

## 2017-12-13 ENCOUNTER — Ambulatory Visit (INDEPENDENT_AMBULATORY_CARE_PROVIDER_SITE_OTHER): Payer: Medicare HMO | Admitting: Orthopaedic Surgery

## 2017-12-13 ENCOUNTER — Encounter (INDEPENDENT_AMBULATORY_CARE_PROVIDER_SITE_OTHER): Payer: Self-pay | Admitting: Orthopaedic Surgery

## 2017-12-13 ENCOUNTER — Ambulatory Visit (INDEPENDENT_AMBULATORY_CARE_PROVIDER_SITE_OTHER): Payer: Medicare HMO

## 2017-12-13 VITALS — BP 170/85 | HR 66 | Ht 63.0 in | Wt 175.0 lb

## 2017-12-13 DIAGNOSIS — M545 Low back pain: Secondary | ICD-10-CM

## 2017-12-13 DIAGNOSIS — M4810 Ankylosing hyperostosis [Forestier], site unspecified: Secondary | ICD-10-CM | POA: Diagnosis not present

## 2017-12-13 NOTE — Progress Notes (Signed)
Office Visit Note   Patient: Nancy Yu           Date of Birth: 1946/12/04           MRN: [id number] Visit Date: 12/13/2017              Requested by: Maurice Small, MD [address] [place], [place] [address] PCP: Maurice Small, MD   Assessment & Plan: Visit Diagnoses:  1. Acute left-sided low back pain, with sciatica presence unspecified   2. DISH (diffuse idiopathic skeletal hyperostosis)     Plan: Patient has some disc degeneration with partial ankylosis particularly at the upper lumbar region.  She may have some lateral recess stenosis on the left side consistent with her symptoms from a bulging disc.  We will set up for some physical therapy recheck in 4 weeks.  He can continue the Aleve.  Follow-Up Instructions: Return in about 4 weeks (around 01/10/2018).   Orders:  Orders Placed This Encounter  Procedures  . XR Lumbar Spine 2-3 Views   No orders of the defined types were placed in this encounter.     Procedures: No procedures performed   Clinical Data: No additional findings.   Subjective: Chief Complaint  Patient presents with  . Lower Back - Pain    HPI 72 year old female seen with back pain and pain on the left side mid lumbar region that is been present for several months.  She has intermittent spasms.  She is had to switch from sleeping in bed she tried sleeping in a recliner and then had trouble getting up and had recently has been sleeping on the sofa.  She states when she crosses her legs it tends to make the pain worse.  She is tried some Aleve which helps a little bit.  She has had some problems with urgency and [facility].  No associated bowel symptoms.  To stand for a fairly long period of time no neurogenic claudication symptoms.  No numbness or tingling in her legs.  She is able to walk up stairs.  Review of Systems 14 point review of systems positive for previous shoulder surgery scopic and she is had a  right rotator cuff repair.  Past history of PVCs, exertional dyspnea, hypertension.  Never smoked.  Positive for bladder problems with frequency, cataracts, hypertension, migraines, rheumatic fever in the distant past and scarlet fever.   Objective: Vital Signs: BP (!) 170/85   Pulse 66   Ht 5\' 3"  (1.6 m)   Wt 175 lb (79.4 kg)   BMI 31.00 kg/m   Physical Exam  Constitutional: She is oriented to person, place, and time. She appears well-developed.  HENT:  Head: Normocephalic.  Right Ear: External ear normal.  Left Ear: External ear normal.  Eyes: Pupils are equal, round, and reactive to light.  Neck: No tracheal deviation present. No thyromegaly present.  Cardiovascular: Normal rate.  Pulmonary/Chest: Effort normal.  Abdominal: Soft.  Neurological: She is alert and oriented to person, place, and time.  Skin: Skin is warm and dry.  Psychiatric: She has a normal mood and affect. Her behavior is normal.    Ortho Exam gets from sitting standing she is having less pain today.  Specialty Comments:  No specialty comments available.  Imaging: Xr Lumbar Spine 2-3 Views  Result Date: 12/13/2017 Lateral lumbar x-ray shows multilevel disc narrowing with anterior spurring at the thoracolumbar junction.  No anterolisthesis.  She does have some lateral  bridging osteophytes  L1- 2.  Hip joints and sacroiliac joints are normal. Impression: Dish with some ankylosis at L1 to and spurring.    PMFS History: Patient Active Problem List   Diagnosis Date Noted  . PVC (premature ventricular contraction) 01/30/2016  . Exertional dyspnea 01/30/2016  . Essential hypertension 01/30/2016  . Abrasion of face 03/07/2014  . Fall 03/07/2014   Past Medical History:  Diagnosis Date  . Arthritis   . Cataracts, bilateral   . Hyperlipidemia   . Hypertension   . Migraines     Family History  Problem Relation Age of Onset  . Cancer Mother        deceased 3  . Cancer Father        deceased 31      Past Surgical History:  Procedure Laterality Date  . ABDOMINAL HYSTERECTOMY    . ROTATOR CUFF REPAIR    . TONSILLECTOMY     Social History   Occupational History  . Not on file  Tobacco Use  . Smoking status: Never Smoker  . Smokeless tobacco: Never Used  Substance and Sexual Activity  . Alcohol use: No  . Drug use: No  . Sexual activity: Not on file

## 2017-12-14 NOTE — Addendum Note (Signed)
Addended by: Meyer Cory on: 12/14/2017 08:11 AM   Modules accepted: Orders

## 2017-12-25 DIAGNOSIS — M545 Low back pain: Secondary | ICD-10-CM | POA: Diagnosis not present

## 2017-12-25 DIAGNOSIS — M5136 Other intervertebral disc degeneration, lumbar region: Secondary | ICD-10-CM | POA: Diagnosis not present

## 2018-01-10 ENCOUNTER — Ambulatory Visit (INDEPENDENT_AMBULATORY_CARE_PROVIDER_SITE_OTHER): Payer: Medicare HMO | Admitting: Orthopaedic Surgery

## 2018-01-24 DIAGNOSIS — H5203 Hypermetropia, bilateral: Secondary | ICD-10-CM | POA: Diagnosis not present

## 2018-01-24 DIAGNOSIS — Z961 Presence of intraocular lens: Secondary | ICD-10-CM | POA: Diagnosis not present

## 2018-01-24 DIAGNOSIS — Z9841 Cataract extraction status, right eye: Secondary | ICD-10-CM | POA: Diagnosis not present

## 2018-01-24 DIAGNOSIS — H52223 Regular astigmatism, bilateral: Secondary | ICD-10-CM | POA: Diagnosis not present

## 2018-01-24 DIAGNOSIS — H40013 Open angle with borderline findings, low risk, bilateral: Secondary | ICD-10-CM | POA: Diagnosis not present

## 2018-01-24 DIAGNOSIS — H26492 Other secondary cataract, left eye: Secondary | ICD-10-CM | POA: Diagnosis not present

## 2018-01-24 DIAGNOSIS — Z0100 Encounter for examination of eyes and vision without abnormal findings: Secondary | ICD-10-CM | POA: Diagnosis not present

## 2018-01-24 DIAGNOSIS — H40053 Ocular hypertension, bilateral: Secondary | ICD-10-CM | POA: Diagnosis not present

## 2018-03-01 ENCOUNTER — Other Ambulatory Visit: Payer: Self-pay | Admitting: Cardiovascular Disease

## 2018-03-01 DIAGNOSIS — I493 Ventricular premature depolarization: Secondary | ICD-10-CM

## 2018-03-21 DIAGNOSIS — H40013 Open angle with borderline findings, low risk, bilateral: Secondary | ICD-10-CM | POA: Diagnosis not present

## 2018-04-22 DIAGNOSIS — E785 Hyperlipidemia, unspecified: Secondary | ICD-10-CM | POA: Diagnosis not present

## 2018-04-22 DIAGNOSIS — I10 Essential (primary) hypertension: Secondary | ICD-10-CM | POA: Diagnosis not present

## 2018-04-22 DIAGNOSIS — E119 Type 2 diabetes mellitus without complications: Secondary | ICD-10-CM | POA: Diagnosis not present

## 2018-07-02 DIAGNOSIS — R69 Illness, unspecified: Secondary | ICD-10-CM | POA: Diagnosis not present

## 2018-07-02 DIAGNOSIS — Z809 Family history of malignant neoplasm, unspecified: Secondary | ICD-10-CM | POA: Diagnosis not present

## 2018-07-02 DIAGNOSIS — Z800 Family history of malignant neoplasm of digestive organs: Secondary | ICD-10-CM | POA: Diagnosis not present

## 2018-07-02 DIAGNOSIS — I10 Essential (primary) hypertension: Secondary | ICD-10-CM | POA: Diagnosis not present

## 2018-07-02 DIAGNOSIS — Z7982 Long term (current) use of aspirin: Secondary | ICD-10-CM | POA: Diagnosis not present

## 2018-07-02 DIAGNOSIS — Z801 Family history of malignant neoplasm of trachea, bronchus and lung: Secondary | ICD-10-CM | POA: Diagnosis not present

## 2018-07-02 DIAGNOSIS — I471 Supraventricular tachycardia: Secondary | ICD-10-CM | POA: Diagnosis not present

## 2018-07-02 DIAGNOSIS — E785 Hyperlipidemia, unspecified: Secondary | ICD-10-CM | POA: Diagnosis not present

## 2018-07-02 DIAGNOSIS — R32 Unspecified urinary incontinence: Secondary | ICD-10-CM | POA: Diagnosis not present

## 2018-08-07 DIAGNOSIS — R69 Illness, unspecified: Secondary | ICD-10-CM | POA: Diagnosis not present

## 2018-08-12 ENCOUNTER — Other Ambulatory Visit: Payer: Self-pay | Admitting: Family Medicine

## 2018-08-12 DIAGNOSIS — Z1231 Encounter for screening mammogram for malignant neoplasm of breast: Secondary | ICD-10-CM

## 2018-08-24 ENCOUNTER — Other Ambulatory Visit: Payer: Self-pay | Admitting: Cardiovascular Disease

## 2018-08-24 DIAGNOSIS — I493 Ventricular premature depolarization: Secondary | ICD-10-CM

## 2018-08-26 NOTE — Telephone Encounter (Signed)
Refill Request.  

## 2018-09-18 ENCOUNTER — Ambulatory Visit
Admission: RE | Admit: 2018-09-18 | Discharge: 2018-09-18 | Disposition: A | Discharge status: Home / Self Care | Payer: Medicare HMO | Source: Ambulatory Visit | Attending: Family Medicine | Admitting: Family Medicine

## 2018-09-18 DIAGNOSIS — Z1231 Encounter for screening mammogram for malignant neoplasm of breast: Secondary | ICD-10-CM

## 2018-09-21 ENCOUNTER — Other Ambulatory Visit: Payer: Self-pay | Admitting: Cardiovascular Disease

## 2018-09-21 DIAGNOSIS — I493 Ventricular premature depolarization: Secondary | ICD-10-CM

## 2018-09-23 NOTE — Telephone Encounter (Signed)
Rx Refill

## 2018-09-23 NOTE — Telephone Encounter (Signed)
Rx request sent to pharmacy.  

## 2018-09-24 DIAGNOSIS — R69 Illness, unspecified: Secondary | ICD-10-CM | POA: Diagnosis not present

## 2018-10-22 ENCOUNTER — Other Ambulatory Visit: Payer: Self-pay | Admitting: Cardiovascular Disease

## 2018-10-22 DIAGNOSIS — I493 Ventricular premature depolarization: Secondary | ICD-10-CM

## 2018-10-22 NOTE — Telephone Encounter (Signed)
Refill Request.  

## 2018-10-23 DIAGNOSIS — H40003 Preglaucoma, unspecified, bilateral: Secondary | ICD-10-CM | POA: Diagnosis not present

## 2018-10-28 ENCOUNTER — Other Ambulatory Visit: Payer: Self-pay | Admitting: Cardiovascular Disease

## 2018-10-28 DIAGNOSIS — I493 Ventricular premature depolarization: Secondary | ICD-10-CM

## 2018-11-13 DIAGNOSIS — J0101 Acute recurrent maxillary sinusitis: Secondary | ICD-10-CM | POA: Diagnosis not present

## 2018-11-13 DIAGNOSIS — Z0000 Encounter for general adult medical examination without abnormal findings: Secondary | ICD-10-CM | POA: Diagnosis not present

## 2018-11-13 DIAGNOSIS — Z1211 Encounter for screening for malignant neoplasm of colon: Secondary | ICD-10-CM | POA: Diagnosis not present

## 2018-11-13 DIAGNOSIS — E119 Type 2 diabetes mellitus without complications: Secondary | ICD-10-CM | POA: Diagnosis not present

## 2018-11-13 DIAGNOSIS — I10 Essential (primary) hypertension: Secondary | ICD-10-CM | POA: Diagnosis not present

## 2018-11-13 DIAGNOSIS — E782 Mixed hyperlipidemia: Secondary | ICD-10-CM | POA: Diagnosis not present

## 2018-11-14 ENCOUNTER — Other Ambulatory Visit: Payer: Self-pay | Admitting: Family Medicine

## 2018-11-14 DIAGNOSIS — E2839 Other primary ovarian failure: Secondary | ICD-10-CM

## 2018-11-16 DIAGNOSIS — Z1211 Encounter for screening for malignant neoplasm of colon: Secondary | ICD-10-CM | POA: Diagnosis not present

## 2018-11-23 ENCOUNTER — Other Ambulatory Visit: Payer: Self-pay | Admitting: Cardiovascular Disease

## 2018-11-23 DIAGNOSIS — I493 Ventricular premature depolarization: Secondary | ICD-10-CM

## 2018-11-25 NOTE — Telephone Encounter (Signed)
Please review for refill.  

## 2018-12-12 ENCOUNTER — Ambulatory Visit
Admission: RE | Admit: 2018-12-12 | Discharge: 2018-12-12 | Disposition: A | Discharge status: Home / Self Care | Payer: Medicare HMO | Source: Ambulatory Visit | Attending: Family Medicine | Admitting: Family Medicine

## 2018-12-12 DIAGNOSIS — E2839 Other primary ovarian failure: Secondary | ICD-10-CM

## 2018-12-12 DIAGNOSIS — M85851 Other specified disorders of bone density and structure, right thigh: Secondary | ICD-10-CM | POA: Diagnosis not present

## 2018-12-12 DIAGNOSIS — Z780 Asymptomatic menopausal state: Secondary | ICD-10-CM | POA: Diagnosis not present

## 2019-02-17 ENCOUNTER — Telehealth: Payer: Self-pay | Admitting: *Deleted

## 2019-02-17 NOTE — Telephone Encounter (Signed)
Call placed to the patient for pre screening COVID-19. Patient denies fever, recent illness or having been around someone who has been sick. She has not traveled internationally in the the last month. She will not be rescheduled at this time.

## 2019-02-18 ENCOUNTER — Ambulatory Visit: Payer: Medicare HMO | Admitting: Cardiovascular Disease

## 2019-02-18 ENCOUNTER — Other Ambulatory Visit: Payer: Self-pay

## 2019-02-18 ENCOUNTER — Encounter: Payer: Self-pay | Admitting: Cardiovascular Disease

## 2019-02-18 VITALS — BP 158/90 | HR 71 | Ht 63.0 in | Wt 175.6 lb

## 2019-02-18 DIAGNOSIS — I10 Essential (primary) hypertension: Secondary | ICD-10-CM | POA: Diagnosis not present

## 2019-02-18 DIAGNOSIS — I493 Ventricular premature depolarization: Secondary | ICD-10-CM | POA: Diagnosis not present

## 2019-02-18 MED ORDER — DILTIAZEM HCL ER COATED BEADS 180 MG PO CP24: 180.0000 mg | ORAL_CAPSULE | Freq: Every day | ORAL | 1 refills | Status: DC

## 2019-02-18 NOTE — Progress Notes (Signed)
Cardiology Office Note   Date:  02/18/2019   ID:  Nancy Yu, DOB 08-30-1946, MRN [id number]  PCP:  Nancy Small, MD  Cardiologist:   Nancy Sacramento, MD   Chief Complaint  Patient presents with  . Follow-up    pt c/o high blood pressure      History of Present Illness: Nancy Yu is a 73 y.o. female who presents for a follow up visit regarding symptomatic PVCs.  She has known history of essential hypertension. She has known history of elevated triglyceride with borderline diabetes. She is not a smoker and has no family history of coronary artery disease. She had cardiac work up in 02/2016 which included:  A normal nuclear stress test . A Holter monitor showed 7000 PVC (5%). Echo was unremarkable.  She was treated with diltiazem with improvement.  She went to the dentist recently for dental extraction but was noted to have severely elevated blood pressure and thus the procedure was canceled.  She did not take her blood pressure medications that morning.  She also noted irregular heartbeats on blood pressure monitor.  She does not feel these PVCs and overall denies chest pain or shortness of breath.   Past Medical History:  Diagnosis Date  . Arthritis   . Cataracts, bilateral   . Hyperlipidemia   . Hypertension   . Migraines     Past Surgical History:  Procedure Laterality Date  . ABDOMINAL HYSTERECTOMY    . ROTATOR CUFF REPAIR    . TONSILLECTOMY       Current Outpatient Medications  Medication Sig Dispense Refill  . aspirin 81 MG tablet Take 81 mg by mouth daily.    . Calcium Carbonate-Vitamin D (CALCIUM 600/VITAMIN D PO) Take 1 tablet by mouth daily.    . carvedilol (COREG) 12.5 MG tablet TAKE 1 TABLET BY MOUTH TWICE A DAY 60 tablet 6  . cholecalciferol (VITAMIN D) 1000 UNITS tablet Take 1,000 Units by mouth daily.    Marland Kitchen diltiazem (CARDIZEM CD) 180 MG 24 hr capsule Take 1 capsule (180 mg total) by mouth daily. 90 capsule 1  . fenofibrate 160 MG tablet  Take 160 mg by mouth daily.    . fluticasone (FLONASE) 50 MCG/ACT nasal spray Place 1 spray into both nostrils as directed.  11  . Omega-3 Fatty Acids (FISH OIL) 1000 MG CAPS Take 1,000 mg by mouth 2 (two) times daily.      No current facility-administered medications for this visit.     Allergies:   Patient has no known allergies.    Social History:  The patient  reports that she has never smoked. She has never used smokeless tobacco. She reports that she does not drink alcohol or use drugs.   Family History:  The patient's family history includes Cancer in her father and mother.    ROS:  Please see the history of present illness.   Otherwise, review of systems are positive for none.   All other systems are reviewed and negative.    PHYSICAL EXAM: VS:  BP (!) 158/90   Pulse 71   Ht 5\' 3"  (1.6 m)   Wt 79.7 kg   BMI 31.11 kg/m  , BMI Body mass index is 31.11 kg/m. GEN: Well nourished, well developed, in no acute distress  HEENT: normal  Neck: no JVD, carotid bruits, or masses Cardiac: RRR; no murmurs, rubs, or gallops,no edema  Respiratory:  clear to auscultation bilaterally, normal work of breathing GI: soft,  nontender, nondistended, + BS MS: no deformity or atrophy  Skin: warm and dry, no rash Neuro:  Strength and sensation are intact Psych: euthymic mood, full affect   EKG:  EKG is ordered today. EKG showed sinus rhythm with frequent PVCs, left ventricular hypertrophy with left anterior fascicular block.   Recent Labs: No results found for requested labs within last 8760 hours.    Lipid Panel No results found for: CHOL, TRIG, HDL, CHOLHDL, VLDL, LDLCALC, LDLDIRECT    Wt Readings from Last 3 Encounters:  02/18/19 79.7 kg  12/13/17 79.4 kg  08/21/17 76.7 kg        ASSESSMENT AND PLAN:  1. Symptomatic PVCs: Increased PVC burden recently.  I elected to increase diltiazem extended release to 180 mg once daily.  She is also on carvedilol.   2. Essential  hypertension: Blood pressure is elevated.  I reviewed her labs done in December which showed normal renal function.  I increased diltiazem as outlined above.  If blood pressure remains elevated, we might need to consider an ACE inhibitor or ARB.   Disposition: Call us in 1 week if no improvement in blood pressure readings.  Otherwise follow-up in 6 months.  Signed,  Nancy Sacramento, MD  02/18/2019 9:19 AM    [place]

## 2019-02-18 NOTE — Patient Instructions (Signed)
Medication Instructions:  INCREASE the Diltiazem to 180 mg once daily  If you need a refill on your cardiac medications before your next appointment, please call your pharmacy.   Lab work: None ordered  Testing/Procedures: None ordered  Follow-Up: At [facility], you and your health needs are our priority.  As part of our continuing mission to provide you with exceptional heart care, we have created designated Provider Care Teams.  These Care Teams include your primary Cardiologist (physician) and Advanced Practice Providers (APPs -  Physician Assistants and Nurse Practitioners) who all work together to provide you with the care you need, when you need it. You will need a follow up appointment in 6 months.  Please call our office 2 months in advance to schedule this appointment.  You may see Kathlyn Sacramento, MD or one of the following Advanced Practice Providers on your designated Care Team:   Kerin Ransom, PA-C Roby Lofts, [place] . Sande Rives, PA-C

## 2019-04-22 DIAGNOSIS — H04203 Unspecified epiphora, bilateral lacrimal glands: Secondary | ICD-10-CM | POA: Diagnosis not present

## 2019-04-22 DIAGNOSIS — H40053 Ocular hypertension, bilateral: Secondary | ICD-10-CM | POA: Diagnosis not present

## 2019-04-22 DIAGNOSIS — H52223 Regular astigmatism, bilateral: Secondary | ICD-10-CM | POA: Diagnosis not present

## 2019-04-22 DIAGNOSIS — H5203 Hypermetropia, bilateral: Secondary | ICD-10-CM | POA: Diagnosis not present

## 2019-04-22 DIAGNOSIS — H1045 Other chronic allergic conjunctivitis: Secondary | ICD-10-CM | POA: Diagnosis not present

## 2019-04-22 DIAGNOSIS — Z9849 Cataract extraction status, unspecified eye: Secondary | ICD-10-CM | POA: Diagnosis not present

## 2019-04-22 DIAGNOSIS — Z961 Presence of intraocular lens: Secondary | ICD-10-CM | POA: Diagnosis not present

## 2019-04-22 DIAGNOSIS — H40013 Open angle with borderline findings, low risk, bilateral: Secondary | ICD-10-CM | POA: Diagnosis not present

## 2019-04-22 DIAGNOSIS — H524 Presbyopia: Secondary | ICD-10-CM | POA: Diagnosis not present

## 2019-04-22 DIAGNOSIS — H40052 Ocular hypertension, left eye: Secondary | ICD-10-CM | POA: Diagnosis not present

## 2019-05-16 DIAGNOSIS — E781 Pure hyperglyceridemia: Secondary | ICD-10-CM | POA: Diagnosis not present

## 2019-05-16 DIAGNOSIS — E782 Mixed hyperlipidemia: Secondary | ICD-10-CM | POA: Diagnosis not present

## 2019-05-16 DIAGNOSIS — E119 Type 2 diabetes mellitus without complications: Secondary | ICD-10-CM | POA: Diagnosis not present

## 2019-05-16 DIAGNOSIS — I10 Essential (primary) hypertension: Secondary | ICD-10-CM | POA: Diagnosis not present

## 2019-06-29 ENCOUNTER — Other Ambulatory Visit: Payer: Self-pay | Admitting: Cardiovascular Disease

## 2019-06-29 DIAGNOSIS — I493 Ventricular premature depolarization: Secondary | ICD-10-CM

## 2019-06-30 NOTE — Telephone Encounter (Signed)
Please review for refill. Thanks!  

## 2019-07-03 ENCOUNTER — Other Ambulatory Visit: Payer: Self-pay

## 2019-07-03 DIAGNOSIS — I493 Ventricular premature depolarization: Secondary | ICD-10-CM

## 2019-07-03 MED ORDER — CARVEDILOL 12.5 MG PO TABS: 12.5000 mg | ORAL_TABLET | Freq: Two times a day (BID) | ORAL | 6 refills | Status: DC

## 2019-08-05 ENCOUNTER — Ambulatory Visit: Payer: Medicare HMO | Admitting: Cardiovascular Disease

## 2019-08-14 ENCOUNTER — Other Ambulatory Visit: Payer: Self-pay | Admitting: Cardiovascular Disease

## 2019-08-14 DIAGNOSIS — I493 Ventricular premature depolarization: Secondary | ICD-10-CM

## 2019-08-14 NOTE — Telephone Encounter (Signed)
Please review for refill. Thanks!  

## 2019-08-15 ENCOUNTER — Other Ambulatory Visit: Payer: Self-pay | Admitting: Family Medicine

## 2019-08-15 DIAGNOSIS — Z1231 Encounter for screening mammogram for malignant neoplasm of breast: Secondary | ICD-10-CM

## 2019-09-30 ENCOUNTER — Other Ambulatory Visit: Payer: Self-pay

## 2019-09-30 ENCOUNTER — Ambulatory Visit
Admission: RE | Admit: 2019-09-30 | Discharge: 2019-09-30 | Disposition: A | Discharge status: Home / Self Care | Payer: Medicare HMO | Source: Ambulatory Visit | Attending: Family Medicine | Admitting: Family Medicine

## 2019-09-30 DIAGNOSIS — Z1231 Encounter for screening mammogram for malignant neoplasm of breast: Secondary | ICD-10-CM

## 2019-10-07 ENCOUNTER — Ambulatory Visit: Payer: Medicare HMO | Admitting: Cardiovascular Disease

## 2019-10-07 DIAGNOSIS — R69 Illness, unspecified: Secondary | ICD-10-CM | POA: Diagnosis not present

## 2019-10-23 DIAGNOSIS — R69 Illness, unspecified: Secondary | ICD-10-CM | POA: Diagnosis not present

## 2019-10-28 DIAGNOSIS — R69 Illness, unspecified: Secondary | ICD-10-CM | POA: Diagnosis not present

## 2019-11-04 ENCOUNTER — Ambulatory Visit: Payer: Medicare HMO | Admitting: Cardiovascular Disease

## 2019-11-04 ENCOUNTER — Other Ambulatory Visit: Payer: Self-pay

## 2019-11-04 VITALS — BP 173/80 | HR 56 | Temp 96.9°F | Ht 63.0 in | Wt 173.0 lb

## 2019-11-04 DIAGNOSIS — I493 Ventricular premature depolarization: Secondary | ICD-10-CM

## 2019-11-04 DIAGNOSIS — Z79899 Other long term (current) drug therapy: Secondary | ICD-10-CM | POA: Diagnosis not present

## 2019-11-04 DIAGNOSIS — I10 Essential (primary) hypertension: Secondary | ICD-10-CM

## 2019-11-04 MED ORDER — DILTIAZEM HCL ER COATED BEADS 180 MG PO CP24: ORAL_CAPSULE | ORAL | 3 refills | Status: DC

## 2019-11-04 MED ORDER — LOSARTAN POTASSIUM 50 MG PO TABS: 50.0000 mg | ORAL_TABLET | Freq: Every day | ORAL | 3 refills | Status: DC

## 2019-11-04 NOTE — Progress Notes (Signed)
Cardiology Office Note   Date:  11/04/2019   ID:  Nancy Yu, DOB Dec 01, 1946, MRN [id number]  PCP:  Maurice Small, MD  Cardiologist:   Kathlyn Sacramento, MD   No chief complaint on file.     History of Present Illness: Nancy Yu is a 73 y.o. female who presents for a follow up visit regarding symptomatic PVCs.  She has known history of essential hypertension. She has known history of elevated triglyceride with borderline diabetes. She is not a smoker and has no family history of coronary artery disease. She had cardiac work up in 02/2016 which included:  A normal nuclear stress test . A Holter monitor showed 7000 PVC (5%). Echo was unremarkable.  She was treated with diltiazem with improvement.  She has been doing reasonably well with no recent chest pain, shortness of breath or palpitations.  She continues to have issues with elevated blood pressure in spite of taking diltiazem and carvedilol.  Diltiazem was increased during his last visit.   Past Medical History:  Diagnosis Date  . Arthritis   . Cataracts, bilateral   . Hyperlipidemia   . Hypertension   . Migraines     Past Surgical History:  Procedure Laterality Date  . ABDOMINAL HYSTERECTOMY    . ROTATOR CUFF REPAIR    . TONSILLECTOMY       Current Outpatient Medications  Medication Sig Dispense Refill  . aspirin 81 MG tablet Take 81 mg by mouth daily.    . Calcium Carbonate-Vitamin D (CALCIUM 600/VITAMIN D PO) Take 1 tablet by mouth daily.    . carvedilol (COREG) 12.5 MG tablet Take 1 tablet (12.5 mg total) by mouth 2 (two) times daily. 60 tablet 6  . cholecalciferol (VITAMIN D) 1000 UNITS tablet Take 1,000 Units by mouth daily.    Marland Kitchen diltiazem (CARDIZEM CD) 180 MG 24 hr capsule TAKE 1 CAPSULE BY MOUTH EVERY DAY 90 capsule 3  . fenofibrate 160 MG tablet Take 160 mg by mouth daily.    . fluticasone (FLONASE) 50 MCG/ACT nasal spray Place 1 spray into both nostrils as directed.  11  . Omega-3 Fatty  Acids (FISH OIL) 1000 MG CAPS Take 1,000 mg by mouth 2 (two) times daily.     Marland Kitchen losartan (COZAAR) 50 MG tablet Take 1 tablet (50 mg total) by mouth daily. 90 tablet 3   No current facility-administered medications for this visit.     Allergies:   Patient has no known allergies.    Social History:  The patient  reports that she has never smoked. She has never used smokeless tobacco. She reports that she does not drink alcohol or use drugs.   Family History:  The patient's family history includes Cancer in her father and mother.    ROS:  Please see the history of present illness.   Otherwise, review of systems are positive for none.   All other systems are reviewed and negative.    PHYSICAL EXAM: VS:  BP (!) 173/80   Pulse (!) 56   Temp (!) 96.9 F (36.1 C)   Ht 5\' 3"  (1.6 m)   Wt 173 lb (78.5 kg)   SpO2 95%   BMI 30.65 kg/m  , BMI Body mass index is 30.65 kg/m. GEN: Well nourished, well developed, in no acute distress  HEENT: normal  Neck: no JVD, carotid bruits, or masses Cardiac: RRR; no murmurs, rubs, or gallops,no edema  Respiratory:  clear to auscultation bilaterally, normal work of  breathing GI: soft, nontender, nondistended, + BS MS: no deformity or atrophy  Skin: warm and dry, no rash Neuro:  Strength and sensation are intact Psych: euthymic mood, full affect   EKG:  EKG is ordered today. EKG showed sinus bradycardia with sinus arrhythmia, incomplete right bundle branch block, left anterior fascicular block, LVH with repolarization abnormalities.   Recent Labs: No results found for requested labs within last 8760 hours.    Lipid Panel No results found for: CHOL, TRIG, HDL, CHOLHDL, VLDL, LDLCALC, LDLDIRECT    Wt Readings from Last 3 Encounters:  11/04/19 173 lb (78.5 kg)  02/18/19 175 lb 9.6 oz (79.7 kg)  12/13/17 175 lb (79.4 kg)        ASSESSMENT AND PLAN:  1. Symptomatic PVCs: She has no PVCs on her EKG or by physical exam today.  Continue  diltiazem and carvedilol.  I do not increasing the dose he is on these 2 medications given bradycardia.     2. Essential hypertension: Blood pressure continues to be uncontrolled.  Thus, I added losartan 50 mg once daily.  Check basic metabolic profile in 1 week.   Disposition:  follow-up in 6 months.  Signed,  Kathlyn Sacramento, MD  11/04/2019 1:38 PM    [place]

## 2019-11-04 NOTE — Patient Instructions (Addendum)
Medication Instructions:  Your physician has recommended you make the following change in your medication:   START LOSARTAN (COZAAR) 50 MG. ONE TABLET BY MOUTH DAILY (EVERY MORNING)  *If you need a refill on your cardiac medications before your next appointment, please call your pharmacy*  Lab Work: Your physician recommends that you return for lab work in 1 WEEK:  [place]  If you have labs (blood work) drawn today and your tests are completely normal, you will receive your results only by: Marland Kitchen MyChart Message (if you have MyChart) OR . A paper copy in the mail If you have any lab test that is abnormal or we need to change your treatment, we will call you to review the results.  Testing/Procedures: NONE  Follow-Up: At [facility], you and your health needs are our priority.  As part of our continuing mission to provide you with exceptional heart care, we have created designated Provider Care Teams.  These Care Teams include your primary Cardiologist (physician) and Advanced Practice Providers (APPs -  Physician Assistants and Nurse Practitioners) who all work together to provide you with the care you need, when you need it.  Your next appointment:   6 month(s)  The format for your next appointment:   Either In Person or Virtual  Provider:   You may see DR. ARIDA or one of the following Advanced Practice Providers on your designated Care Team:    Kerin Ransom, PA-C  [place], [place]  Coletta Memos, [place]

## 2019-11-11 DIAGNOSIS — Z79899 Other long term (current) drug therapy: Secondary | ICD-10-CM | POA: Diagnosis not present

## 2019-11-11 LAB — BASIC METABOLIC PANEL
BUN/Creatinine Ratio: 28 (ref 12–28)
BUN: 21 mg/dL (ref 8–27)
CO2: 25 mmol/L (ref 20–29)
Calcium: 10.1 mg/dL (ref 8.7–10.3)
Chloride: 100 mmol/L (ref 96–106)
Creatinine, Ser: 0.76 mg/dL (ref 0.57–1.00)
GFR calc Af Amer: 89 mL/min/{1.73_m2} (ref >59)
GFR calc non Af Amer: 77 mL/min/{1.73_m2} (ref >59)
Glucose: 171 mg/dL — ABNORMAL HIGH (ref 65–99)
Potassium: 4 mmol/L (ref 3.5–5.2)
Sodium: 139 mmol/L (ref 134–144)

## 2019-12-10 DIAGNOSIS — E782 Mixed hyperlipidemia: Secondary | ICD-10-CM | POA: Diagnosis not present

## 2019-12-10 DIAGNOSIS — I10 Essential (primary) hypertension: Secondary | ICD-10-CM | POA: Diagnosis not present

## 2019-12-10 DIAGNOSIS — Z0000 Encounter for general adult medical examination without abnormal findings: Secondary | ICD-10-CM | POA: Diagnosis not present

## 2019-12-10 DIAGNOSIS — E119 Type 2 diabetes mellitus without complications: Secondary | ICD-10-CM | POA: Diagnosis not present

## 2019-12-10 DIAGNOSIS — Z1211 Encounter for screening for malignant neoplasm of colon: Secondary | ICD-10-CM | POA: Diagnosis not present

## 2019-12-22 DIAGNOSIS — M545 Low back pain: Secondary | ICD-10-CM | POA: Diagnosis not present

## 2019-12-22 DIAGNOSIS — M25551 Pain in right hip: Secondary | ICD-10-CM | POA: Diagnosis not present

## 2019-12-24 DIAGNOSIS — Z85828 Personal history of other malignant neoplasm of skin: Secondary | ICD-10-CM | POA: Diagnosis not present

## 2019-12-24 DIAGNOSIS — L570 Actinic keratosis: Secondary | ICD-10-CM | POA: Diagnosis not present

## 2019-12-24 DIAGNOSIS — L821 Other seborrheic keratosis: Secondary | ICD-10-CM | POA: Diagnosis not present

## 2020-01-14 DIAGNOSIS — E119 Type 2 diabetes mellitus without complications: Secondary | ICD-10-CM | POA: Diagnosis not present

## 2020-01-14 DIAGNOSIS — I10 Essential (primary) hypertension: Secondary | ICD-10-CM | POA: Diagnosis not present

## 2020-01-14 DIAGNOSIS — E785 Hyperlipidemia, unspecified: Secondary | ICD-10-CM | POA: Diagnosis not present

## 2020-02-02 ENCOUNTER — Other Ambulatory Visit: Payer: Self-pay | Admitting: Cardiovascular Disease

## 2020-02-02 DIAGNOSIS — I493 Ventricular premature depolarization: Secondary | ICD-10-CM

## 2020-02-09 DIAGNOSIS — R69 Illness, unspecified: Secondary | ICD-10-CM | POA: Diagnosis not present

## 2020-02-11 DIAGNOSIS — I10 Essential (primary) hypertension: Secondary | ICD-10-CM | POA: Diagnosis not present

## 2020-02-11 DIAGNOSIS — R69 Illness, unspecified: Secondary | ICD-10-CM | POA: Diagnosis not present

## 2020-02-16 DIAGNOSIS — R69 Illness, unspecified: Secondary | ICD-10-CM | POA: Diagnosis not present

## 2020-02-23 DIAGNOSIS — L570 Actinic keratosis: Secondary | ICD-10-CM | POA: Diagnosis not present

## 2020-02-23 DIAGNOSIS — L821 Other seborrheic keratosis: Secondary | ICD-10-CM | POA: Diagnosis not present

## 2020-02-23 DIAGNOSIS — Z85828 Personal history of other malignant neoplasm of skin: Secondary | ICD-10-CM | POA: Diagnosis not present

## 2020-05-10 DIAGNOSIS — I10 Essential (primary) hypertension: Secondary | ICD-10-CM | POA: Diagnosis not present

## 2020-05-10 DIAGNOSIS — E119 Type 2 diabetes mellitus without complications: Secondary | ICD-10-CM | POA: Diagnosis not present

## 2020-05-10 DIAGNOSIS — E785 Hyperlipidemia, unspecified: Secondary | ICD-10-CM | POA: Diagnosis not present

## 2020-06-04 ENCOUNTER — Emergency Department (HOSPITAL_BASED_OUTPATIENT_CLINIC_OR_DEPARTMENT_OTHER)
Admission: EM | Admit: 2020-06-04 | Discharge: 2020-06-04 | Disposition: A | Discharge status: Home / Self Care | Payer: Medicare HMO | Attending: Emergency Medicine | Admitting: Emergency Medicine

## 2020-06-04 ENCOUNTER — Emergency Department (HOSPITAL_BASED_OUTPATIENT_CLINIC_OR_DEPARTMENT_OTHER): Payer: Medicare HMO

## 2020-06-04 ENCOUNTER — Encounter (HOSPITAL_BASED_OUTPATIENT_CLINIC_OR_DEPARTMENT_OTHER): Payer: Self-pay

## 2020-06-04 ENCOUNTER — Other Ambulatory Visit: Payer: Self-pay

## 2020-06-04 DIAGNOSIS — M62830 Muscle spasm of back: Secondary | ICD-10-CM | POA: Diagnosis not present

## 2020-06-04 DIAGNOSIS — Z7982 Long term (current) use of aspirin: Secondary | ICD-10-CM | POA: Diagnosis not present

## 2020-06-04 DIAGNOSIS — I10 Essential (primary) hypertension: Secondary | ICD-10-CM | POA: Insufficient documentation

## 2020-06-04 DIAGNOSIS — Z79899 Other long term (current) drug therapy: Secondary | ICD-10-CM | POA: Insufficient documentation

## 2020-06-04 DIAGNOSIS — R0602 Shortness of breath: Secondary | ICD-10-CM | POA: Insufficient documentation

## 2020-06-04 DIAGNOSIS — R918 Other nonspecific abnormal finding of lung field: Secondary | ICD-10-CM | POA: Diagnosis not present

## 2020-06-04 DIAGNOSIS — I700 Atherosclerosis of aorta: Secondary | ICD-10-CM | POA: Diagnosis not present

## 2020-06-04 DIAGNOSIS — M545 Low back pain: Secondary | ICD-10-CM | POA: Diagnosis present

## 2020-06-04 DIAGNOSIS — J90 Pleural effusion, not elsewhere classified: Secondary | ICD-10-CM | POA: Diagnosis not present

## 2020-06-04 DIAGNOSIS — M47814 Spondylosis without myelopathy or radiculopathy, thoracic region: Secondary | ICD-10-CM | POA: Diagnosis not present

## 2020-06-04 DIAGNOSIS — R0600 Dyspnea, unspecified: Secondary | ICD-10-CM | POA: Diagnosis not present

## 2020-06-04 LAB — BASIC METABOLIC PANEL
Anion gap: 11 (ref 5–15)
BUN: 20 mg/dL (ref 8–23)
CO2: 26 mmol/L (ref 22–32)
Calcium: 10 mg/dL (ref 8.9–10.3)
Chloride: 101 mmol/L (ref 98–111)
Creatinine, Ser: 0.78 mg/dL (ref 0.44–1.00)
GFR calc Af Amer: >60 mL/min (ref >60)
GFR calc non Af Amer: >60 mL/min (ref >60)
Glucose, Bld: 99 mg/dL (ref 70–99)
Potassium: 3.8 mmol/L (ref 3.5–5.1)
Sodium: 138 mmol/L (ref 135–145)

## 2020-06-04 LAB — CBC WITH DIFFERENTIAL/PLATELET
Abs Immature Granulocytes: 0.01 10*3/uL (ref 0.00–0.07)
Basophils Absolute: 0 10*3/uL (ref 0.0–0.1)
Basophils Relative: 0 %
Eosinophils Absolute: 0.1 10*3/uL (ref 0.0–0.5)
Eosinophils Relative: 2 %
HCT: 41.9 % (ref 36.0–46.0)
Hemoglobin: 14.2 g/dL (ref 12.0–15.0)
Immature Granulocytes: 0 %
Lymphocytes Relative: 24 %
Lymphs Abs: 1.3 10*3/uL (ref 0.7–4.0)
MCH: 29.3 pg (ref 26.0–34.0)
MCHC: 33.9 g/dL (ref 30.0–36.0)
MCV: 86.6 fL (ref 80.0–100.0)
Monocytes Absolute: 0.5 10*3/uL (ref 0.1–1.0)
Monocytes Relative: 9 %
Neutro Abs: 3.6 10*3/uL (ref 1.7–7.7)
Neutrophils Relative %: 65 %
Platelets: 163 10*3/uL (ref 150–400)
RBC: 4.84 MIL/uL (ref 3.87–5.11)
RDW: 12.2 % (ref 11.5–15.5)
WBC: 5.4 10*3/uL (ref 4.0–10.5)
nRBC: 0 % (ref 0.0–0.2)

## 2020-06-04 LAB — TROPONIN I (HIGH SENSITIVITY)
Troponin I (High Sensitivity): 7 ng/L (ref <18)
Troponin I (High Sensitivity): 8 ng/L (ref <18)

## 2020-06-04 LAB — D-DIMER, QUANTITATIVE: D-Dimer, Quant: 0.53 ug/mL-FEU — ABNORMAL HIGH (ref 0.00–0.50)

## 2020-06-04 MED ORDER — METHOCARBAMOL 500 MG PO TABS
500.0000 mg | ORAL_TABLET | Freq: Once | ORAL | Status: AC
  Administered 2020-06-04: 500 mg via ORAL
  Filled 2020-06-04: qty 1

## 2020-06-04 MED ORDER — IOHEXOL 350 MG/ML SOLN
100.0000 mL | Freq: Once | INTRAVENOUS | Status: AC | PRN
  Administered 2020-06-04: 100 mL via INTRAVENOUS

## 2020-06-04 MED ORDER — METHOCARBAMOL 500 MG PO TABS: 500.0000 mg | ORAL_TABLET | Freq: Two times a day (BID) | ORAL | 0 refills | Status: DC | PRN

## 2020-06-04 NOTE — ED Triage Notes (Signed)
Pt states was getting ready this morning, when she put her hands over her head felt sharp pain in back that took her breath away. Pain constant.  Denies nausea, denies dizziness.

## 2020-06-04 NOTE — Discharge Instructions (Signed)
Your comprehensive work-up here today was very reassuring.  Please take your extra strength Tylenol, as needed.  You were given a prescription for Robaxin which is a muscle relaxer.  You should not drive, work, consume alcohol, or operate machinery while taking this medication as it can make you very drowsy.    You will need to follow-up with your cardiologist as well as your PCP regarding today's encounter.  Please return to the ER or seek immediate medical attention should you experience any new or worsening symptoms.

## 2020-06-04 NOTE — ED Provider Notes (Signed)
[facility] EMERGENCY DEPARTMENT Provider Note   CSN: [id number] Arrival date & time: 06/04/20  1208     History Chief Complaint  Patient presents with  . Back Pain  . Shortness of Breath    Nancy Yu is a 74 y.o. female with PMH of HTN and HLD who presents to the ED with acute onset back pain and shortness of breath.  Patient reports that she fell entirely well when she first woke up this morning, however was doing her hair when she felt sudden onset 10 out of 10 left-sided upper thoracic region pain described as "sharp".  She is also having discomfort on her left-sided lateral chest wall, but not reproducible with palpation.  She is denying any true chest pain, but is concerned for atypical MI.  She denies any history of clots, clotting disorder, unilateral extremity edema, recent surgery or immobilization, hormone use, or smoking.  She she denies any recent cough, emesis, fevers or chills, abdominal pain, nausea, diaphoresis, exertional chest pain, dysuria, increased urinary frequency, or other symptoms.   HPI     Past Medical History:  Diagnosis Date  . Arthritis   . Cataracts, bilateral   . Hyperlipidemia   . Hypertension   . Migraines     Patient Active Problem List   Diagnosis Date Noted  . DISH (diffuse idiopathic skeletal hyperostosis) 12/13/2017  . PVC (premature ventricular contraction) 01/30/2016  . Exertional dyspnea 01/30/2016  . Essential hypertension 01/30/2016  . Abrasion of face 03/07/2014  . Fall 03/07/2014    Past Surgical History:  Procedure Laterality Date  . ABDOMINAL HYSTERECTOMY    . ROTATOR CUFF REPAIR    . TONSILLECTOMY       OB History   No obstetric history on file.     Family History  Problem Relation Age of Onset  . Cancer Mother        deceased 98  . Cancer Father        deceased 51    Social History   Tobacco Use  . Smoking status: Never Smoker  . Smokeless tobacco: Never Used  Substance Use Topics  .  Alcohol use: No  . Drug use: No    Home Medications Prior to Admission medications   Medication Sig Start Date End Date Taking? Authorizing Provider  aspirin 81 MG tablet Take 81 mg by mouth daily.    [provider]  Calcium Carbonate-Vitamin D (CALCIUM 600/VITAMIN D PO) Take 1 tablet by mouth daily.    [provider]  carvedilol (COREG) 12.5 MG tablet TAKE 1 TABLET (12.5 MG TOTAL) BY MOUTH 2 (TWO) TIMES DAILY. 02/02/20   Wellington Hampshire, MD  cholecalciferol (VITAMIN D) 1000 UNITS tablet Take 1,000 Units by mouth daily.    [provider]  diltiazem (CARDIZEM CD) 180 MG 24 hr capsule TAKE 1 CAPSULE BY MOUTH EVERY DAY 11/04/19   Wellington Hampshire, MD  fenofibrate 160 MG tablet Take 160 mg by mouth daily.    [provider]  fluticasone (FLONASE) 50 MCG/ACT nasal spray Place 1 spray into both nostrils as directed. 02/05/16   [provider]  losartan (COZAAR) 100 MG tablet Take 100 mg by mouth daily. 04/12/20   [provider]  losartan (COZAAR) 50 MG tablet Take 1 tablet (50 mg total) by mouth daily. 11/04/19 10/29/20  Wellington Hampshire, MD  methocarbamol (ROBAXIN) 500 MG tablet Take 1 tablet (500 mg total) by mouth 2 (two) times daily as needed  for muscle spasms. 06/04/20   Corena Herter, PA-C  Omega-3 Fatty Acids (FISH OIL) 1000 MG CAPS Take 1,000 mg by mouth 2 (two) times daily.     [provider]  sertraline (ZOLOFT) 50 MG tablet Take by mouth. 04/12/20   [provider]    Allergies    Statins  Review of Systems   Review of Systems  All other systems reviewed and are negative.   Physical Exam Updated Vital Signs BP (!) 149/71   Pulse (!) 47   Temp 98.3 F (36.8 C) (Oral)   Resp 16   Ht 5\' 2"  (1.575 m)   Wt 73 kg   SpO2 (!) 89%   BMI 29.45 kg/m   Physical Exam Vitals and nursing note reviewed. Exam conducted with a chaperone present.  HENT:     Head: Normocephalic and atraumatic.  Eyes:      General: No scleral icterus.    Conjunctiva/sclera: Conjunctivae normal.  Cardiovascular:     Rate and Rhythm: Normal rate and regular rhythm.     Pulses: Normal pulses.     Heart sounds: Normal heart sounds.     Comments: Peripheral pulses are intact and symmetric. Pulmonary:     Comments: Breath sounds intact bilaterally.  No increased respiratory effort.  Pain with deep inspiration.  No abnormal breath sounds.  No respiratory distress. Musculoskeletal:     Cervical back: Normal range of motion and neck supple. No rigidity.     Right lower leg: No edema.     Left lower leg: No edema.     Comments: No extremity weakness or numbness.  Skin:    General: Skin is dry.     Capillary Refill: Capillary refill takes less than 2 seconds.  Neurological:     General: No focal deficit present.     Mental Status: She is alert.     GCS: GCS eye subscore is 4. GCS verbal subscore is 5. GCS motor subscore is 6.  Psychiatric:        Mood and Affect: Mood normal.        Behavior: Behavior normal.        Thought Content: Thought content normal.     ED Results / Procedures / Treatments   Labs (all labs ordered are listed, but only abnormal results are displayed) Labs Reviewed  D-DIMER, QUANTITATIVE (NOT AT [facility]) - Abnormal; Notable for the following components:      Result Value   D-Dimer, Quant 0.53 (*)    All other components within normal limits  BASIC METABOLIC PANEL  CBC WITH DIFFERENTIAL/PLATELET  TROPONIN I (HIGH SENSITIVITY)  TROPONIN I (HIGH SENSITIVITY)    EKG EKG Interpretation  Date/Time:  Friday June 04 2020 12:30:34 EDT Ventricular Rate:  56 PR Interval:    QRS Duration: 119 QT Interval:  448 QTC Calculation: 433 R Axis:   -54 Text Interpretation: Sinus rhythm Left anterior fascicular block Left ventricular hypertrophy Borderline T abnormalities, anterior leads No significant change since 01/2006 Confirmed by Blanchie Dessert [phone]) on 06/04/2020 12:37:05  PM   Radiology DG Chest 2 View  Result Date: 06/04/2020 CLINICAL DATA:  Back pain, dyspnea EXAM: CHEST - 2 VIEW COMPARISON:  01/05/2016 FINDINGS: The lungs are well expanded, are symmetric, and are clear. No pneumothorax or pleural effusion. Left atrial enlargement is suspected though global cardiac size is within normal limits. Pulmonary vascularity is normal. Degenerative changes are seen within the thoracic spine. No acute bone abnormality. IMPRESSION: No radiographic  evidence of acute cardiopulmonary disease Electronically Signed   By: Fidela Salisbury MD   On: 06/04/2020 13:31   CT Angio Chest Aorta w/CM &/OR wo/CM  Result Date: 06/04/2020 CLINICAL DATA:  Posterior chest pain. EXAM: CT ANGIOGRAPHY CHEST WITH CONTRAST TECHNIQUE: Multidetector CT imaging of the chest was performed using the standard protocol during bolus administration of intravenous contrast. Multiplanar CT image reconstructions and MIPs were obtained to evaluate the vascular anatomy. CONTRAST:  167mL OMNIPAQUE IOHEXOL 350 MG/ML SOLN COMPARISON:  None. FINDINGS: Cardiovascular: There is mild calcification of the aortic arch. Satisfactory opacification of the pulmonary arteries to the segmental level. No evidence of pulmonary embolism. Normal heart size. No pericardial effusion. Mediastinum/Nodes: No enlarged mediastinal, hilar, or axillary lymph nodes. Thyroid gland, trachea, and esophagus demonstrate no significant findings. Lungs/Pleura: Lungs are clear. No pleural effusion or pneumothorax. Upper Abdomen: No acute abnormality. Musculoskeletal: Multilevel degenerative changes seen throughout the thoracic spine. Review of the MIP images confirms the above findings. IMPRESSION: 1. No CT evidence of pulmonary embolism or active cardiopulmonary disease. 2. Mild calcification of the aortic arch. Aortic Atherosclerosis (ICD10-I70.0). Electronically Signed   By: Virgina Norfolk M.D.   On: 06/04/2020 15:28    Procedures Procedures  (including critical care time)  Medications Ordered in ED Medications  methocarbamol (ROBAXIN) tablet 500 mg (has no administration in time range)  iohexol (OMNIPAQUE) 350 MG/ML injection 100 mL (100 mLs Intravenous Contrast Given 06/04/20 1458)    ED Course  I have reviewed the triage vital signs and the nursing notes.  Pertinent labs & imaging results that were available during my care of the patient were reviewed by me and considered in my medical decision making (see chart for details).    MDM Rules/Calculators/A&P                          Labs BMP: WNL. CBC: WNL. D-dimer: Mildly elevated at 0.53.  WNL given her age. Troponin: 7 >> 8  EKG: No significant changes when compared to prior tracing.  Imaging: Plain films obtained of chest are personally reviewed and demonstrate no evidence of acute cardiopulmonary disease. CT Angio chest and aorta is reviewed and there is no evidence of pulmonary embolism or dissection.  Patient's history and physical exam is suggestive of possible spasm.  She does endorse worsening of her pain symptoms when she internally rotates her left shoulder and reaches across her chest.  Given that her symptoms are reproducible with certain movements, suspect that there is musculoskeletal etiology.  Will encourage Robaxin, and a muscle relaxant, and extra strength Tylenol.  Patient did become mildly bradycardic at times during her ED encounter, but she denied any associated lightheadedness or chest discomfort.  She is on metoprolol and states that this is common occurrence for her.  She will follow-up with her cardiologist regarding today's encounter.  She will also notify her PCP.  All of the evaluation and work-up results were discussed with the patient and any family at bedside.  Patient and/or family were informed that while patient is appropriate for discharge at this time, some medical emergencies may only develop or become detectable after a period of  time.  I specifically instructed patient and/or family to return to return to the ED or seek immediate medical attention for any new or worsening symptoms.  They were provided opportunity to ask any additional questions and have none at this time.  Prior to discharge patient is feeling well, agreeable with  plan for discharge home.  They have expressed understanding of verbal discharge instructions as well as return precautions and are agreeable to the plan.   Patient counseled to never drive or operate heavy machinery while taking narcotic or other sedating medication.   Final Clinical Impression(s) / ED Diagnoses Final diagnoses:  Back muscle spasm    Rx / DC Orders ED Discharge Orders         Ordered    methocarbamol (ROBAXIN) 500 MG tablet  2 times daily PRN     Discontinue     06/04/20 1735           Corena Herter, PA-C 06/04/20 1735    Blanchie Dessert, MD 06/15/20 1608

## 2020-06-05 ENCOUNTER — Other Ambulatory Visit: Payer: Self-pay | Admitting: Cardiovascular Disease

## 2020-06-05 DIAGNOSIS — I493 Ventricular premature depolarization: Secondary | ICD-10-CM

## 2020-06-08 NOTE — Telephone Encounter (Signed)
Refill request

## 2020-07-01 ENCOUNTER — Other Ambulatory Visit: Payer: Self-pay | Admitting: Cardiovascular Disease

## 2020-07-01 DIAGNOSIS — I493 Ventricular premature depolarization: Secondary | ICD-10-CM

## 2020-07-01 NOTE — Telephone Encounter (Signed)
Refill Request.  

## 2020-07-20 ENCOUNTER — Ambulatory Visit: Payer: Medicare HMO | Admitting: Cardiovascular Disease

## 2020-07-20 ENCOUNTER — Encounter: Payer: Self-pay | Admitting: Cardiovascular Disease

## 2020-07-20 ENCOUNTER — Other Ambulatory Visit: Payer: Self-pay

## 2020-07-20 VITALS — BP 126/76 | HR 55 | Ht 62.0 in | Wt 165.0 lb

## 2020-07-20 DIAGNOSIS — I493 Ventricular premature depolarization: Secondary | ICD-10-CM

## 2020-07-20 DIAGNOSIS — I10 Essential (primary) hypertension: Secondary | ICD-10-CM | POA: Diagnosis not present

## 2020-07-20 NOTE — Progress Notes (Signed)
Cardiology Office Note   Date:  07/20/2020   ID:  Nancy Yu, DOB 21-Mar-1946, MRN [id number]  PCP:  Maurice Small, MD  Cardiologist:   Kathlyn Sacramento, MD   No chief complaint on file.     History of Present Illness: Nancy Yu is a 74 y.o. female who presents for a follow up visit regarding symptomatic PVCs and essential hypertension..  She has known history of elevated triglyceride with borderline diabetes. She is not a smoker and has no family history of coronary artery disease. She had cardiac work up in 02/2016 which included:  A normal nuclear stress test . A Holter monitor showed 7000 PVC (5%). Echo was unremarkable.  She was treated with diltiazem with improvement. During last visit, her blood pressure was significantly elevated.  Losartan was added.  The dose was increased by her primary care physician to 100 mg once daily and since then blood pressure has been reasonably controlled.  She continues to take diltiazem and carvedilol.  She went to the emergency room in July with back discomfort.  Troponin was normal.  She was suspected of having back muscle spasm and was treated with a muscle relaxant with subsequent improvement.  She denies chest pain or worsening dyspnea.  Past Medical History:  Diagnosis Date  . Arthritis   . Cataracts, bilateral   . Hyperlipidemia   . Hypertension   . Migraines     Past Surgical History:  Procedure Laterality Date  . ABDOMINAL HYSTERECTOMY    . ROTATOR CUFF REPAIR    . TONSILLECTOMY       Current Outpatient Medications  Medication Sig Dispense Refill  . aspirin 81 MG tablet Take 81 mg by mouth daily.    . Calcium Carbonate-Vitamin D (CALCIUM 600/VITAMIN D PO) Take 1 tablet by mouth daily.    . carvedilol (COREG) 12.5 MG tablet TAKE 1 TABLET (12.5 MG TOTAL) BY MOUTH 2 (TWO) TIMES DAILY. 60 tablet 0  . cholecalciferol (VITAMIN D) 1000 UNITS tablet Take 1,000 Units by mouth daily.    Marland Kitchen diltiazem (CARDIZEM CD) 180 MG  24 hr capsule TAKE 1 CAPSULE BY MOUTH EVERY DAY 90 capsule 3  . fenofibrate 160 MG tablet Take 160 mg by mouth daily.    Marland Kitchen losartan (COZAAR) 100 MG tablet Take 100 mg by mouth daily.    . methocarbamol (ROBAXIN) 500 MG tablet Take 1 tablet (500 mg total) by mouth 2 (two) times daily as needed for muscle spasms. 20 tablet 0  . Omega-3 Fatty Acids (FISH OIL) 1000 MG CAPS Take 1,000 mg by mouth 2 (two) times daily.     . sertraline (ZOLOFT) 50 MG tablet Take by mouth.     No current facility-administered medications for this visit.    Allergies:   Statins    Social History:  The patient  reports that she has never smoked. She has never used smokeless tobacco. She reports that she does not drink alcohol and does not use drugs.   Family History:  The patient's family history includes Cancer in her father and mother.    ROS:  Please see the history of present illness.   Otherwise, review of systems are positive for none.   All other systems are reviewed and negative.    PHYSICAL EXAM: VS:  BP 126/76   Pulse (!) 55   Ht 5\' 2"  (1.575 m)   Wt 165 lb (74.8 kg)   SpO2 96%   BMI 30.18 kg/m  ,  BMI Body mass index is 30.18 kg/m. GEN: Well nourished, well developed, in no acute distress  HEENT: normal  Neck: no JVD, carotid bruits, or masses Cardiac: RRR; no murmurs, rubs, or gallops,no edema  Respiratory:  clear to auscultation bilaterally, normal work of breathing GI: soft, nontender, nondistended, + BS MS: no deformity or atrophy  Skin: warm and dry, no rash Neuro:  Strength and sensation are intact Psych: euthymic mood, full affect Distal pulses are normal.   EKG:  EKG is ordered today. EKG showed sinus bradycardia with sinus arrhythmia and first-degree AV block, left anterior fascicular block, LVH with repolarization abnormalities.   Recent Labs: 06/04/2020: BUN 20; Creatinine, Ser 0.78; Hemoglobin 14.2; Platelets 163; Potassium 3.8; Sodium 138    Lipid Panel No results found  for: CHOL, TRIG, HDL, CHOLHDL, VLDL, LDLCALC, LDLDIRECT    Wt Readings from Last 3 Encounters:  07/20/20 165 lb (74.8 kg)  06/04/20 161 lb (73 kg)  11/04/19 173 lb (78.5 kg)        ASSESSMENT AND PLAN:  1. Symptomatic PVCs: She has no PVCs on her EKG or by physical exam today.  Continue diltiazem and carvedilol.    2. Essential hypertension: Blood pressure is now well controlled since the addition of losartan.  3.  Back and hip discomfort: Does not seem to be due to peripheral arterial disease as she has normal distal pulses.  Disposition:  follow-up in 6 months.  Signed,  Kathlyn Sacramento, MD  07/20/2020 8:56 AM    [place]

## 2020-07-20 NOTE — Patient Instructions (Signed)
Medication Instructions:  No Changes In Medications at this time.  *If you need a refill on your cardiac medications before your next appointment, please call your pharmacy*   Lab Work: None Ordered At This Time.   If you have labs (blood work) drawn today and your tests are completely normal, you will receive your results only by: Marland Kitchen MyChart Message (if you have MyChart) OR . A paper copy in the mail If you have any lab test that is abnormal or we need to change your treatment, we will call you to review the results.   Testing/Procedures: None Ordered At This Time.    Follow-Up: At [facility], you and your health needs are our priority.  As part of our continuing mission to provide you with exceptional heart care, we have created designated Provider Care Teams.  These Care Teams include your primary Cardiologist (physician) and Advanced Practice Providers (APPs -  Physician Assistants and Nurse Practitioners) who all work together to provide you with the care you need, when you need it.  We recommend signing up for the patient portal called "MyChart".  Sign up information is provided on this After Visit Summary.  MyChart is used to connect with patients for Virtual Visits (Telemedicine).  Patients are able to view lab/test results, encounter notes, upcoming appointments, etc.  Non-urgent messages can be sent to your provider as well.   To learn more about what you can do with MyChart, go to [email or web address].    Your next appointment:   12 month(s)  The format for your next appointment:   In Person  Provider:   Kathlyn Sacramento, MD   Other Instructions

## 2020-07-22 IMAGING — MG DIGITAL SCREENING BILATERAL MAMMOGRAM WITH TOMO AND CAD
1 series · 1 of 1 positions shown · non-contrast
Comparison: Previous exam(s).

CLINICAL DATA: Screening.

EXAM:
DIGITAL SCREENING BILATERAL MAMMOGRAM WITH TOMO AND CAD

[R MLO synth-2D]
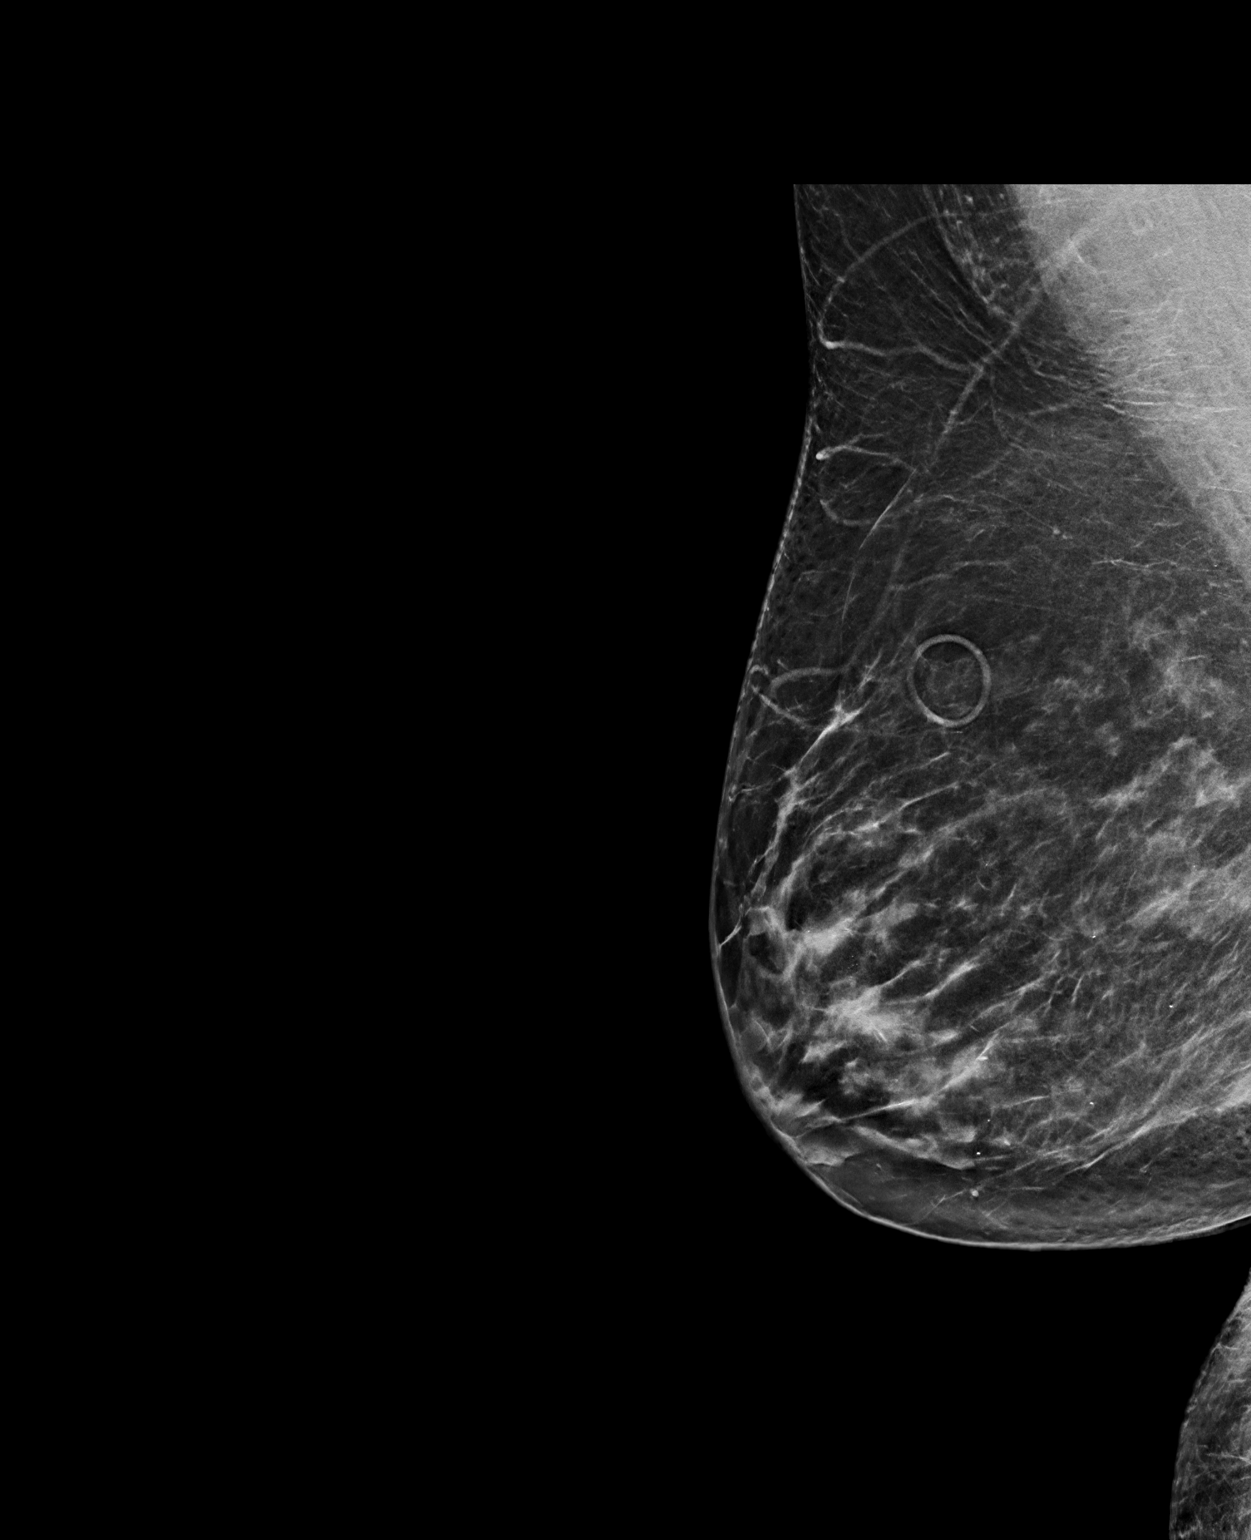

[1 of 1 positions shown; findings below may reference images not displayed]

ACR Breast Density Category c: The breast tissue is heterogeneously
dense, which may obscure small masses.
FINDINGS: There are no findings suspicious for malignancy. Images were
processed with CAD.
IMPRESSION: No mammographic evidence of malignancy. A result letter of this
screening mammogram will be mailed directly to the patient.

RECOMMENDATION:
Screening mammogram in one year. (Code:[id number])

BI-RADS CATEGORY  1: Negative.

## 2020-07-25 ENCOUNTER — Other Ambulatory Visit: Payer: Self-pay | Admitting: Cardiovascular Disease

## 2020-07-25 DIAGNOSIS — I493 Ventricular premature depolarization: Secondary | ICD-10-CM

## 2020-08-25 ENCOUNTER — Other Ambulatory Visit: Payer: Self-pay | Admitting: Family Medicine

## 2020-08-25 DIAGNOSIS — Z1231 Encounter for screening mammogram for malignant neoplasm of breast: Secondary | ICD-10-CM

## 2020-10-07 ENCOUNTER — Ambulatory Visit: Payer: Medicare HMO

## 2020-10-26 DIAGNOSIS — M1611 Unilateral primary osteoarthritis, right hip: Secondary | ICD-10-CM | POA: Diagnosis not present

## 2020-11-02 ENCOUNTER — Other Ambulatory Visit: Payer: Self-pay | Admitting: Cardiovascular Disease

## 2020-11-02 DIAGNOSIS — E669 Obesity, unspecified: Secondary | ICD-10-CM | POA: Diagnosis not present

## 2020-11-02 DIAGNOSIS — R69 Illness, unspecified: Secondary | ICD-10-CM | POA: Diagnosis not present

## 2020-11-02 DIAGNOSIS — Z833 Family history of diabetes mellitus: Secondary | ICD-10-CM | POA: Diagnosis not present

## 2020-11-02 DIAGNOSIS — Z7982 Long term (current) use of aspirin: Secondary | ICD-10-CM | POA: Diagnosis not present

## 2020-11-02 DIAGNOSIS — Z809 Family history of malignant neoplasm, unspecified: Secondary | ICD-10-CM | POA: Diagnosis not present

## 2020-11-02 DIAGNOSIS — E785 Hyperlipidemia, unspecified: Secondary | ICD-10-CM | POA: Diagnosis not present

## 2020-11-02 DIAGNOSIS — I10 Essential (primary) hypertension: Secondary | ICD-10-CM | POA: Diagnosis not present

## 2020-11-02 DIAGNOSIS — N3941 Urge incontinence: Secondary | ICD-10-CM | POA: Diagnosis not present

## 2020-11-02 DIAGNOSIS — I493 Ventricular premature depolarization: Secondary | ICD-10-CM

## 2020-11-02 DIAGNOSIS — Z6830 Body mass index (BMI) 30.0-30.9, adult: Secondary | ICD-10-CM | POA: Diagnosis not present

## 2020-11-02 DIAGNOSIS — I499 Cardiac arrhythmia, unspecified: Secondary | ICD-10-CM | POA: Diagnosis not present

## 2020-11-02 NOTE — Telephone Encounter (Signed)
NL pt. 

## 2020-11-12 ENCOUNTER — Other Ambulatory Visit: Payer: Self-pay

## 2020-11-12 ENCOUNTER — Ambulatory Visit
Admission: RE | Admit: 2020-11-12 | Discharge: 2020-11-12 | Disposition: A | Discharge status: Home / Self Care | Payer: Medicare HMO | Source: Ambulatory Visit | Attending: Family Medicine | Admitting: Family Medicine

## 2020-11-12 DIAGNOSIS — Z1231 Encounter for screening mammogram for malignant neoplasm of breast: Secondary | ICD-10-CM

## 2020-11-23 ENCOUNTER — Telehealth: Payer: Self-pay | Admitting: Cardiovascular Disease

## 2020-11-23 NOTE — Telephone Encounter (Signed)
° °  [facility] Pre-operative Risk Assessment    HEARTCARE STAFF: - Please ensure there is not already an duplicate clearance open for this procedure. - Under Visit Info/Reason for Call, type in Other and utilize the format Clearance MM/DD/YY or Clearance TBD. Do not use dashes or single digits. - If request is for dental extraction, please clarify the # of teeth to be extracted.  Request for surgical clearance:  1. What type of surgery is being performed? Right anterior hip arthroplasty  2. When is this surgery scheduled? 12/17/20  3. What type of clearance is required (medical clearance vs. Pharmacy clearance to hold med vs. Both)? both  4. Are there any medications that need to be held prior to surgery and how long? Not listed, advise if needed  5. Practice name and name of physician performing surgery? [facility] - Dr Frederik Pear  6. What is the office phone number? [phone]   7.   What is the office fax number? [phone]  8.   Anesthesia type (None, local, MAC, general) ? spinal   Nancy Yu 11/23/2020, 4:15 PM  _________________________________________________________________   (provider comments below)

## 2020-11-24 NOTE — Telephone Encounter (Signed)
Nancy Yu 74 year old female is requesting a right anterior hip arthroplasty. She was last seen in clinic by you on 07/20/2020. She denied chest pain and worsening dyspnea at that time.  Her PMH includes symptomatic PVCs, HTN, and HLD. She underwent ischemic evaluation 3/17 which showed a normal nuclear stress test. Her long-term cardiac monitor showed 7000 PVCs (5%). Her echocardiogram was unremarkable. She was started on diltiazem which helped significantly. During her last visit her blood pressure was reasonably well controlled on diltiazem and carvedilol. 126/76. EKG showed sinus bradycardia with sinus arrhythmia and first-degree AV block, left anterior fascicular block LVH with repolarization abnormalities.  May her aspirin be held for upcoming procedure?  Thank you for your help. Please direct response to [facility] preop pool.   [name] Nancy Bornhorst NP-C    11/24/2020, 6:44 AM [place] Office [phone] Fax [phone]

## 2020-11-25 NOTE — Telephone Encounter (Signed)
Yes aspirin can be held 5 days before surgery if needed.

## 2020-11-25 NOTE — Telephone Encounter (Signed)
   Primary Cardiologist: Kathlyn Sacramento, MD  Chart reviewed as part of pre-operative protocol coverage. Given past medical history and time since last visit, based on ACC/AHA guidelines, Nancy Yu would be at acceptable risk for the planned procedure without further cardiovascular testing.   Her aspirin may be held for 5 days prior to her surgery if needed.  Please resume as soon as hemostasis is achieved.  Patient was advised that if she develops new symptoms prior to surgery to contact our office to arrange a follow-up appointment.  She verbalized understanding.  I will route this recommendation to the requesting party via Epic fax function and remove from pre-op pool.  Please call with questions.  [name] [name] NP-C    11/25/2020, 10:59 AM [place] [address] Office [phone] Fax [phone]

## 2020-12-07 DIAGNOSIS — E782 Mixed hyperlipidemia: Secondary | ICD-10-CM | POA: Diagnosis not present

## 2020-12-07 DIAGNOSIS — I10 Essential (primary) hypertension: Secondary | ICD-10-CM | POA: Diagnosis not present

## 2020-12-07 DIAGNOSIS — Z5181 Encounter for therapeutic drug level monitoring: Secondary | ICD-10-CM | POA: Diagnosis not present

## 2020-12-07 DIAGNOSIS — Z01818 Encounter for other preprocedural examination: Secondary | ICD-10-CM | POA: Diagnosis not present

## 2020-12-07 DIAGNOSIS — E119 Type 2 diabetes mellitus without complications: Secondary | ICD-10-CM | POA: Diagnosis not present

## 2020-12-09 DIAGNOSIS — E119 Type 2 diabetes mellitus without complications: Secondary | ICD-10-CM | POA: Diagnosis not present

## 2020-12-09 DIAGNOSIS — E782 Mixed hyperlipidemia: Secondary | ICD-10-CM | POA: Diagnosis not present

## 2020-12-09 DIAGNOSIS — M1611 Unilateral primary osteoarthritis, right hip: Secondary | ICD-10-CM | POA: Diagnosis not present

## 2020-12-09 DIAGNOSIS — Z0000 Encounter for general adult medical examination without abnormal findings: Secondary | ICD-10-CM | POA: Diagnosis not present

## 2020-12-09 DIAGNOSIS — I10 Essential (primary) hypertension: Secondary | ICD-10-CM | POA: Diagnosis not present

## 2020-12-09 DIAGNOSIS — R69 Illness, unspecified: Secondary | ICD-10-CM | POA: Diagnosis not present

## 2020-12-17 DIAGNOSIS — M1611 Unilateral primary osteoarthritis, right hip: Secondary | ICD-10-CM | POA: Diagnosis not present

## 2020-12-24 DIAGNOSIS — Z4789 Encounter for other orthopedic aftercare: Secondary | ICD-10-CM | POA: Diagnosis not present

## 2020-12-24 DIAGNOSIS — Z96641 Presence of right artificial hip joint: Secondary | ICD-10-CM | POA: Diagnosis not present

## 2020-12-28 DIAGNOSIS — Z96641 Presence of right artificial hip joint: Secondary | ICD-10-CM | POA: Diagnosis not present

## 2020-12-28 DIAGNOSIS — B001 Herpesviral vesicular dermatitis: Secondary | ICD-10-CM | POA: Diagnosis not present

## 2020-12-28 DIAGNOSIS — Z4789 Encounter for other orthopedic aftercare: Secondary | ICD-10-CM | POA: Diagnosis not present

## 2020-12-30 DIAGNOSIS — Z4789 Encounter for other orthopedic aftercare: Secondary | ICD-10-CM | POA: Diagnosis not present

## 2020-12-30 DIAGNOSIS — Z96641 Presence of right artificial hip joint: Secondary | ICD-10-CM | POA: Diagnosis not present

## 2020-12-30 DIAGNOSIS — Z471 Aftercare following joint replacement surgery: Secondary | ICD-10-CM | POA: Diagnosis not present

## 2021-01-04 DIAGNOSIS — Z4789 Encounter for other orthopedic aftercare: Secondary | ICD-10-CM | POA: Diagnosis not present

## 2021-01-04 DIAGNOSIS — Z96641 Presence of right artificial hip joint: Secondary | ICD-10-CM | POA: Diagnosis not present

## 2021-01-06 DIAGNOSIS — Z4789 Encounter for other orthopedic aftercare: Secondary | ICD-10-CM | POA: Diagnosis not present

## 2021-01-06 DIAGNOSIS — Z96641 Presence of right artificial hip joint: Secondary | ICD-10-CM | POA: Diagnosis not present

## 2021-01-11 DIAGNOSIS — Z96641 Presence of right artificial hip joint: Secondary | ICD-10-CM | POA: Diagnosis not present

## 2021-01-11 DIAGNOSIS — Z4789 Encounter for other orthopedic aftercare: Secondary | ICD-10-CM | POA: Diagnosis not present

## 2021-01-18 DIAGNOSIS — Z4789 Encounter for other orthopedic aftercare: Secondary | ICD-10-CM | POA: Diagnosis not present

## 2021-01-18 DIAGNOSIS — Z96641 Presence of right artificial hip joint: Secondary | ICD-10-CM | POA: Diagnosis not present

## 2021-01-24 DIAGNOSIS — Z85828 Personal history of other malignant neoplasm of skin: Secondary | ICD-10-CM | POA: Diagnosis not present

## 2021-01-24 DIAGNOSIS — L309 Dermatitis, unspecified: Secondary | ICD-10-CM | POA: Diagnosis not present

## 2021-03-04 DIAGNOSIS — J209 Acute bronchitis, unspecified: Secondary | ICD-10-CM | POA: Diagnosis not present

## 2021-03-04 DIAGNOSIS — J019 Acute sinusitis, unspecified: Secondary | ICD-10-CM | POA: Diagnosis not present

## 2021-03-07 DIAGNOSIS — L570 Actinic keratosis: Secondary | ICD-10-CM | POA: Diagnosis not present

## 2021-03-07 DIAGNOSIS — L308 Other specified dermatitis: Secondary | ICD-10-CM | POA: Diagnosis not present

## 2021-03-07 DIAGNOSIS — D1801 Hemangioma of skin and subcutaneous tissue: Secondary | ICD-10-CM | POA: Diagnosis not present

## 2021-03-07 DIAGNOSIS — D485 Neoplasm of uncertain behavior of skin: Secondary | ICD-10-CM | POA: Diagnosis not present

## 2021-03-07 DIAGNOSIS — L821 Other seborrheic keratosis: Secondary | ICD-10-CM | POA: Diagnosis not present

## 2021-03-07 DIAGNOSIS — Z85828 Personal history of other malignant neoplasm of skin: Secondary | ICD-10-CM | POA: Diagnosis not present

## 2021-03-07 DIAGNOSIS — D225 Melanocytic nevi of trunk: Secondary | ICD-10-CM | POA: Diagnosis not present

## 2021-03-07 DIAGNOSIS — L738 Other specified follicular disorders: Secondary | ICD-10-CM | POA: Diagnosis not present

## 2021-04-07 DIAGNOSIS — H5212 Myopia, left eye: Secondary | ICD-10-CM | POA: Diagnosis not present

## 2021-04-15 DIAGNOSIS — Z0100 Encounter for examination of eyes and vision without abnormal findings: Secondary | ICD-10-CM | POA: Diagnosis not present

## 2021-04-15 DIAGNOSIS — H524 Presbyopia: Secondary | ICD-10-CM | POA: Diagnosis not present

## 2021-05-31 DIAGNOSIS — E669 Obesity, unspecified: Secondary | ICD-10-CM | POA: Diagnosis not present

## 2021-05-31 DIAGNOSIS — Z7982 Long term (current) use of aspirin: Secondary | ICD-10-CM | POA: Diagnosis not present

## 2021-05-31 DIAGNOSIS — Z8249 Family history of ischemic heart disease and other diseases of the circulatory system: Secondary | ICD-10-CM | POA: Diagnosis not present

## 2021-05-31 DIAGNOSIS — Z833 Family history of diabetes mellitus: Secondary | ICD-10-CM | POA: Diagnosis not present

## 2021-05-31 DIAGNOSIS — E785 Hyperlipidemia, unspecified: Secondary | ICD-10-CM | POA: Diagnosis not present

## 2021-05-31 DIAGNOSIS — R69 Illness, unspecified: Secondary | ICD-10-CM | POA: Diagnosis not present

## 2021-05-31 DIAGNOSIS — Z85828 Personal history of other malignant neoplasm of skin: Secondary | ICD-10-CM | POA: Diagnosis not present

## 2021-05-31 DIAGNOSIS — Z6830 Body mass index (BMI) 30.0-30.9, adult: Secondary | ICD-10-CM | POA: Diagnosis not present

## 2021-05-31 DIAGNOSIS — I10 Essential (primary) hypertension: Secondary | ICD-10-CM | POA: Diagnosis not present

## 2021-05-31 DIAGNOSIS — Z9181 History of falling: Secondary | ICD-10-CM | POA: Diagnosis not present

## 2021-08-02 ENCOUNTER — Encounter: Payer: Self-pay | Admitting: Cardiovascular Disease

## 2021-08-02 ENCOUNTER — Other Ambulatory Visit: Payer: Self-pay | Admitting: Cardiovascular Disease

## 2021-08-02 ENCOUNTER — Other Ambulatory Visit: Payer: Self-pay

## 2021-08-02 ENCOUNTER — Ambulatory Visit: Payer: Medicare HMO | Admitting: Cardiovascular Disease

## 2021-08-02 VITALS — BP 140/70 | HR 52 | Ht 62.0 in | Wt 168.0 lb

## 2021-08-02 DIAGNOSIS — I10 Essential (primary) hypertension: Secondary | ICD-10-CM

## 2021-08-02 DIAGNOSIS — I493 Ventricular premature depolarization: Secondary | ICD-10-CM

## 2021-08-02 MED ORDER — CARVEDILOL 12.5 MG PO TABS: 12.5000 mg | ORAL_TABLET | Freq: Two times a day (BID) | ORAL | 11 refills | Status: DC

## 2021-08-02 MED ORDER — DILTIAZEM HCL ER COATED BEADS 180 MG PO CP24
180.0000 mg | ORAL_CAPSULE | Freq: Every day | ORAL | 3 refills | Status: DC
Start: 2021-08-02 — End: 2022-08-01

## 2021-08-02 NOTE — Progress Notes (Signed)
Cardiology Office Note   Date:  08/02/2021   ID:  Nancy Yu, DOB 1946-07-23, MRN [id number]  PCP:  Maurice Small, MD  Cardiologist:   Kathlyn Sacramento, MD   No chief complaint on file.     History of Present Illness: Nancy Yu is a 75 y.o. female who presents for a follow up visit regarding symptomatic PVCs and essential hypertension..  She has known history of elevated triglyceride with borderline diabetes. She is not a smoker and has no family history of coronary artery disease. She had cardiac work up in 02/2016 which included:  A normal nuclear stress test . A Holter monitor showed 7000 PVC (5%). Echo was unremarkable.  She was treated with diltiazem with improvement.  She underwent right hip replacement earlier this year with significant improvement in functionality since then.  She feels great.  She denies chest pain, shortness of breath, palpitations or dizziness.  Past Medical History:  Diagnosis Date   Arthritis    Cataracts, bilateral    Hyperlipidemia    Hypertension    Migraines     Past Surgical History:  Procedure Laterality Date   ABDOMINAL HYSTERECTOMY     ROTATOR CUFF REPAIR     TONSILLECTOMY       Current Outpatient Medications  Medication Sig Dispense Refill   aspirin 81 MG tablet Take 81 mg by mouth daily.     Calcium Carbonate-Vitamin D (CALCIUM 600/VITAMIN D PO) Take 1 tablet by mouth daily.     cholecalciferol (VITAMIN D) 1000 UNITS tablet Take 1,000 Units by mouth daily.     fenofibrate 160 MG tablet Take 160 mg by mouth daily.     methocarbamol (ROBAXIN) 500 MG tablet Take 1 tablet (500 mg total) by mouth 2 (two) times daily as needed for muscle spasms. 20 tablet 0   Omega-3 Fatty Acids (FISH OIL) 1000 MG CAPS Take 1,000 mg by mouth 2 (two) times daily.      sertraline (ZOLOFT) 50 MG tablet Take by mouth.     valsartan (DIOVAN) 320 MG tablet Take 320 mg by mouth daily.     carvedilol (COREG) 12.5 MG tablet Take 1 tablet (12.5  mg total) by mouth 2 (two) times daily. 60 tablet 11   diltiazem (CARDIZEM CD) 180 MG 24 hr capsule Take 1 capsule (180 mg total) by mouth daily. 90 capsule 3   No current facility-administered medications for this visit.    Allergies:   Statins    Social History:  The patient  reports that she has never smoked. She has never used smokeless tobacco. She reports that she does not drink alcohol and does not use drugs.   Family History:  The patient's family history includes Cancer in her father and mother.    ROS:  Please see the history of present illness.   Otherwise, review of systems are positive for none.   All other systems are reviewed and negative.    PHYSICAL EXAM: VS:  BP 140/70 (BP Location: Right Arm)   Pulse (!) 52   Ht '5\' 2"'$  (1.575 m)   Wt 168 lb (76.2 kg)   SpO2 97%   BMI 30.73 kg/m  , BMI Body mass index is 30.73 kg/m. GEN: Well nourished, well developed, in no acute distress  HEENT: normal  Neck: no JVD, carotid bruits, or masses Cardiac: RRR; no murmurs, rubs, or gallops,no edema  Respiratory:  clear to auscultation bilaterally, normal work of breathing GI: soft, nontender, nondistended, +  BS MS: no deformity or atrophy  Skin: warm and dry, no rash Neuro:  Strength and sensation are intact Psych: euthymic mood, full affect Distal pulses are normal.   EKG:  EKG is ordered today. EKG showed sinus bradycardia with sinus arrhythmia and first-degree AV block, LVH with repolarization abnormalities and left anterior fascicular block.   Recent Labs: No results found for requested labs within last 8760 hours.    Lipid Panel No results found for: CHOL, TRIG, HDL, CHOLHDL, VLDL, LDLCALC, LDLDIRECT    Wt Readings from Last 3 Encounters:  08/02/21 168 lb (76.2 kg)  07/20/20 165 lb (74.8 kg)  06/04/20 161 lb (73 kg)        ASSESSMENT AND PLAN:  1. Symptomatic PVCs: She has no PVCs on her EKG or by physical exam today.  Continue diltiazem and carvedilol.   She is chronically bradycardic but does not have symptoms related to that, if bradycardia worsens, consider decreasing the dose of diltiazem. I refilled diltiazem.   2. Essential hypertension: She was switched from losartan to valsartan and blood pressure is reasonably controlled.  I refilled carvedilol.    Disposition:  follow-up in 12 months.  Signed,  Kathlyn Sacramento, MD  08/02/2021 10:53 AM    [facility]

## 2021-08-02 NOTE — Patient Instructions (Signed)
Medication Instructions:  Your physician recommends that you continue on your current medications as directed. Please refer to the Current Medication list given to you today.  *If you need a refill on your cardiac medications before your next appointment, please call your pharmacy*   Lab Work: none If you have labs (blood work) drawn today and your tests are completely normal, you will receive your results only by: [place] (if you have MyChart) OR A paper copy in the mail If you have any lab test that is abnormal or we need to change your treatment, we will call you to review the results.   Testing/Procedures: none   Follow-Up: At [facility], you and your health needs are our priority.  As part of our continuing mission to provide you with exceptional heart care, we have created designated Provider Care Teams.  These Care Teams include your primary Cardiologist (physician) and Advanced Practice Providers (APPs -  Physician Assistants and Nurse Practitioners) who all work together to provide you with the care you need, when you need it.  We recommend signing up for the patient portal called "MyChart".  Sign up information is provided on this After Visit Summary.  MyChart is used to connect with patients for Virtual Visits (Telemedicine).  Patients are able to view lab/test results, encounter notes, upcoming appointments, etc.  Non-urgent messages can be sent to your provider as well.   To learn more about what you can do with MyChart, go to [email or web address].    Your next appointment:   1 year(s)  The format for your next appointment:   In Person  Provider:   Kathlyn Sacramento, MD   Other Instructions none

## 2021-08-05 DIAGNOSIS — Z23 Encounter for immunization: Secondary | ICD-10-CM | POA: Diagnosis not present

## 2021-11-21 ENCOUNTER — Telehealth: Payer: Self-pay | Admitting: Cardiovascular Disease

## 2021-11-21 MED ORDER — VALSARTAN 320 MG PO TABS: 320.0000 mg | ORAL_TABLET | Freq: Every day | ORAL | 3 refills | Status: DC

## 2021-11-21 NOTE — Telephone Encounter (Signed)
Refills has been sent to the pharmacy. 

## 2021-11-21 NOTE — Telephone Encounter (Signed)
°*  STAT* If patient is at the pharmacy, call can be transferred to refill team.   1. Which medications need to be refilled? (please list name of each medication and dose if known) valsartan (DIOVAN) 320 MG tablet  2. Which pharmacy/location (including street and city if local pharmacy) is medication to be sent to? [id number] - [place], [place] - [address]  3. Do they need a 30 day or 90 day supply? 90 day   Patient states she her PCP Dr. Justin Mend was refilling her medication, but she is not her PCP anymore and requests Dr. Fletcher Anon continue her refills.

## 2021-12-15 DIAGNOSIS — E7800 Pure hypercholesterolemia, unspecified: Secondary | ICD-10-CM | POA: Diagnosis not present

## 2021-12-15 DIAGNOSIS — R69 Illness, unspecified: Secondary | ICD-10-CM | POA: Diagnosis not present

## 2021-12-15 DIAGNOSIS — E1169 Type 2 diabetes mellitus with other specified complication: Secondary | ICD-10-CM | POA: Diagnosis not present

## 2021-12-15 DIAGNOSIS — Z1211 Encounter for screening for malignant neoplasm of colon: Secondary | ICD-10-CM | POA: Diagnosis not present

## 2021-12-15 DIAGNOSIS — Z79899 Other long term (current) drug therapy: Secondary | ICD-10-CM | POA: Diagnosis not present

## 2021-12-15 DIAGNOSIS — E669 Obesity, unspecified: Secondary | ICD-10-CM | POA: Diagnosis not present

## 2021-12-15 DIAGNOSIS — Z0001 Encounter for general adult medical examination with abnormal findings: Secondary | ICD-10-CM | POA: Diagnosis not present

## 2021-12-15 DIAGNOSIS — I10 Essential (primary) hypertension: Secondary | ICD-10-CM | POA: Diagnosis not present

## 2021-12-16 ENCOUNTER — Other Ambulatory Visit: Payer: Self-pay | Admitting: Family Medicine

## 2021-12-16 DIAGNOSIS — Z1231 Encounter for screening mammogram for malignant neoplasm of breast: Secondary | ICD-10-CM

## 2021-12-21 DIAGNOSIS — M1812 Unilateral primary osteoarthritis of first carpometacarpal joint, left hand: Secondary | ICD-10-CM | POA: Diagnosis not present

## 2021-12-21 DIAGNOSIS — M79642 Pain in left hand: Secondary | ICD-10-CM | POA: Diagnosis not present

## 2021-12-21 DIAGNOSIS — G5602 Carpal tunnel syndrome, left upper limb: Secondary | ICD-10-CM | POA: Diagnosis not present

## 2021-12-28 DIAGNOSIS — G5612 Other lesions of median nerve, left upper limb: Secondary | ICD-10-CM | POA: Diagnosis not present

## 2021-12-28 DIAGNOSIS — G5602 Carpal tunnel syndrome, left upper limb: Secondary | ICD-10-CM | POA: Diagnosis not present

## 2021-12-29 ENCOUNTER — Other Ambulatory Visit: Payer: Self-pay | Admitting: Orthopedic Surgery

## 2021-12-29 ENCOUNTER — Telehealth: Payer: Self-pay

## 2021-12-29 ENCOUNTER — Encounter (HOSPITAL_BASED_OUTPATIENT_CLINIC_OR_DEPARTMENT_OTHER): Payer: Self-pay | Admitting: Orthopedic Surgery

## 2021-12-29 NOTE — Telephone Encounter (Signed)
° ° °  Patient Name: Nancy Yu  DOB: 1946-10-08 MRN: [id number]  Primary Cardiologist: Kathlyn Sacramento, MD  Chart reviewed as part of pre-operative protocol coverage. Given past medical history and time since last visit, based on ACC/AHA guidelines, Nancy Yu would be at acceptable risk for the planned procedure without further cardiovascular testing. Okay to hold aspirin 5-7 days prior.   The patient was advised that if she develops new symptoms prior to surgery to contact our office to arrange for a follow-up visit, and she verbalized understanding.  I will route this recommendation to the requesting party via Epic fax function and remove from pre-op pool.  Please call with questions.  [place], [place] 12/29/2021, 2:58 PM

## 2021-12-29 NOTE — Telephone Encounter (Signed)
° °  Pre-operative Risk Assessment    Patient Name: Nancy Yu  DOB: 04-06-1946 MRN: [id number]      Request for Surgical Clearance    Procedure:   Left Carpel Tunnel Release   Date of Surgery:  Clearance 01/09/22                                 Surgeon:  Lattie Haw Surgeon's Group or Practice Name:  [facility] Phone number:  [phone] Fax number:  [phone]   Type of Clearance Requested:   - Medical  - Pharmacy:  Hold Aspirin     Type of Anesthesia:   Choice   Additional requests/questions:  Please advise surgeon/provider what medications should be held. Please fax a copy of clearance to the surgeon's office.  Evalee Mutton   12/29/2021, 2:22 PM

## 2022-01-04 ENCOUNTER — Ambulatory Visit
Admission: RE | Admit: 2022-01-04 | Discharge: 2022-01-04 | Disposition: A | Discharge status: Home / Self Care | Payer: Medicare HMO | Source: Ambulatory Visit | Attending: Family Medicine | Admitting: Family Medicine

## 2022-01-04 DIAGNOSIS — Z1231 Encounter for screening mammogram for malignant neoplasm of breast: Secondary | ICD-10-CM

## 2022-01-09 ENCOUNTER — Ambulatory Visit (HOSPITAL_BASED_OUTPATIENT_CLINIC_OR_DEPARTMENT_OTHER)
Admission: RE | Admit: 2022-01-09 | Discharge: 2022-01-09 | Disposition: A | Discharge status: Home / Self Care | Payer: Medicare HMO | Attending: Orthopedic Surgery | Admitting: Orthopedic Surgery

## 2022-01-09 ENCOUNTER — Other Ambulatory Visit: Payer: Self-pay

## 2022-01-09 ENCOUNTER — Encounter (HOSPITAL_BASED_OUTPATIENT_CLINIC_OR_DEPARTMENT_OTHER): Payer: Self-pay | Admitting: Orthopedic Surgery

## 2022-01-09 ENCOUNTER — Encounter (HOSPITAL_BASED_OUTPATIENT_CLINIC_OR_DEPARTMENT_OTHER)
Admission: RE | Disposition: A | Discharge status: Home / Self Care | Payer: Self-pay | Source: Home / Self Care | Attending: Orthopedic Surgery

## 2022-01-09 ENCOUNTER — Ambulatory Visit (HOSPITAL_BASED_OUTPATIENT_CLINIC_OR_DEPARTMENT_OTHER): Payer: Medicare HMO | Admitting: Anesthesiology

## 2022-01-09 DIAGNOSIS — G5602 Carpal tunnel syndrome, left upper limb: Secondary | ICD-10-CM | POA: Insufficient documentation

## 2022-01-09 DIAGNOSIS — M1990 Unspecified osteoarthritis, unspecified site: Secondary | ICD-10-CM | POA: Diagnosis not present

## 2022-01-09 DIAGNOSIS — I10 Essential (primary) hypertension: Secondary | ICD-10-CM | POA: Diagnosis not present

## 2022-01-09 HISTORY — PX: CARPAL TUNNEL RELEASE: SHX101

## 2022-01-09 SURGERY — CARPAL TUNNEL RELEASE
Anesthesia: Monitor Anesthesia Care | Site: Hand | Laterality: Left

## 2022-01-09 MED ORDER — ACETAMINOPHEN 160 MG/5ML PO SOLN: 325.0000 mg | ORAL | Status: DC | PRN

## 2022-01-09 MED ORDER — LIDOCAINE HCL (CARDIAC) PF 100 MG/5ML IV SOSY
PREFILLED_SYRINGE | INTRAVENOUS | Status: DC | PRN
  Administered 2022-01-09: 35 mL via INTRAVENOUS

## 2022-01-09 MED ORDER — FENTANYL CITRATE (PF) 100 MCG/2ML IJ SOLN
INTRAMUSCULAR | Status: DC | PRN
  Administered 2022-01-09: 50 ug via INTRAVENOUS

## 2022-01-09 MED ORDER — ONDANSETRON HCL 4 MG/2ML IJ SOLN: 4.0000 mg | Freq: Once | INTRAMUSCULAR | Status: DC | PRN

## 2022-01-09 MED ORDER — 0.9 % SODIUM CHLORIDE (POUR BTL) OPTIME
TOPICAL | Status: DC | PRN
  Administered 2022-01-09: 50 mL

## 2022-01-09 MED ORDER — OXYCODONE HCL 5 MG PO TABS: 5.0000 mg | ORAL_TABLET | Freq: Once | ORAL | Status: DC | PRN

## 2022-01-09 MED ORDER — CEFAZOLIN SODIUM-DEXTROSE 2-4 GM/100ML-% IV SOLN
2.0000 g | INTRAVENOUS | Status: AC
  Administered 2022-01-09: 2 g via INTRAVENOUS

## 2022-01-09 MED ORDER — OXYCODONE HCL 5 MG/5ML PO SOLN: 5.0000 mg | Freq: Once | ORAL | Status: DC | PRN

## 2022-01-09 MED ORDER — ACETAMINOPHEN 325 MG PO TABS: 325.0000 mg | ORAL_TABLET | ORAL | Status: DC | PRN

## 2022-01-09 MED ORDER — OXYCODONE HCL 5 MG PO TABS: ORAL_TABLET | ORAL | 0 refills | Status: AC

## 2022-01-09 MED ORDER — BUPIVACAINE HCL (PF) 0.25 % IJ SOLN
INTRAMUSCULAR | Status: DC | PRN
  Administered 2022-01-09: 9 mL

## 2022-01-09 MED ORDER — CEFAZOLIN SODIUM-DEXTROSE 2-4 GM/100ML-% IV SOLN
INTRAVENOUS | Status: AC
  Filled 2022-01-09: qty 100

## 2022-01-09 MED ORDER — LACTATED RINGERS IV SOLN: INTRAVENOUS | Status: DC

## 2022-01-09 MED ORDER — FENTANYL CITRATE (PF) 100 MCG/2ML IJ SOLN: 25.0000 ug | INTRAMUSCULAR | Status: DC | PRN

## 2022-01-09 MED ORDER — PROPOFOL 10 MG/ML IV BOLUS
INTRAVENOUS | Status: AC
  Filled 2022-01-09: qty 20

## 2022-01-09 MED ORDER — ONDANSETRON HCL 4 MG/2ML IJ SOLN
INTRAMUSCULAR | Status: DC | PRN
  Administered 2022-01-09: 4 mg via INTRAVENOUS

## 2022-01-09 MED ORDER — PROPOFOL 500 MG/50ML IV EMUL
INTRAVENOUS | Status: DC | PRN
  Administered 2022-01-09: 75 ug/kg/min via INTRAVENOUS

## 2022-01-09 MED ORDER — LIDOCAINE 2% (20 MG/ML) 5 ML SYRINGE
INTRAMUSCULAR | Status: AC
  Filled 2022-01-09: qty 5

## 2022-01-09 MED ORDER — FENTANYL CITRATE (PF) 100 MCG/2ML IJ SOLN
INTRAMUSCULAR | Status: AC
  Filled 2022-01-09: qty 2

## 2022-01-09 MED ORDER — MEPERIDINE HCL 25 MG/ML IJ SOLN: 6.2500 mg | INTRAMUSCULAR | Status: DC | PRN

## 2022-01-09 SURGICAL SUPPLY — 37 items
APL PRP STRL LF DISP 70% ISPRP (MISCELLANEOUS) ×1
BLADE SURG 15 STRL LF DISP TIS (BLADE) ×2 IMPLANT
BLADE SURG 15 STRL SS (BLADE) ×4
BNDG CMPR 9X4 STRL LF SNTH (GAUZE/BANDAGES/DRESSINGS) ×1
BNDG ELASTIC 3X5.8 VLCR STR LF (GAUZE/BANDAGES/DRESSINGS) ×2 IMPLANT
BNDG ESMARK 4X9 LF (GAUZE/BANDAGES/DRESSINGS) ×1 IMPLANT
BNDG GAUZE ELAST 4 BULKY (GAUZE/BANDAGES/DRESSINGS) ×2 IMPLANT
CHLORAPREP W/TINT 26 (MISCELLANEOUS) ×2 IMPLANT
CORD BIPOLAR FORCEPS 12FT (ELECTRODE) ×2 IMPLANT
COVER BACK TABLE 60X90IN (DRAPES) ×2 IMPLANT
COVER MAYO STAND STRL (DRAPES) ×2 IMPLANT
CUFF TOURN SGL QUICK 18X4 (TOURNIQUET CUFF) ×1 IMPLANT
DRAPE EXTREMITY T 121X128X90 (DISPOSABLE) ×2 IMPLANT
DRAPE SURG 17X23 STRL (DRAPES) ×1 IMPLANT
DRAPE U-SHAPE 47X51 STRL (DRAPES) ×1 IMPLANT
DRSG PAD ABDOMINAL 8X10 ST (GAUZE/BANDAGES/DRESSINGS) ×2 IMPLANT
GAUZE SPONGE 4X4 12PLY STRL (GAUZE/BANDAGES/DRESSINGS) ×2 IMPLANT
GAUZE XEROFORM 1X8 LF (GAUZE/BANDAGES/DRESSINGS) ×2 IMPLANT
GLOVE SRG 8 PF TXTR STRL LF DI (GLOVE) ×1 IMPLANT
GLOVE SURG ENC MOIS LTX SZ7.5 (GLOVE) ×2 IMPLANT
GLOVE SURG UNDER POLY LF SZ7 (GLOVE) ×1 IMPLANT
GLOVE SURG UNDER POLY LF SZ8 (GLOVE) ×2
GOWN STRL REUS W/ TWL LRG LVL3 (GOWN DISPOSABLE) ×1 IMPLANT
GOWN STRL REUS W/TWL LRG LVL3 (GOWN DISPOSABLE) ×2
GOWN STRL REUS W/TWL XL LVL3 (GOWN DISPOSABLE) ×2 IMPLANT
NDL HYPO 25X1 1.5 SAFETY (NEEDLE) ×1 IMPLANT
NEEDLE HYPO 25X1 1.5 SAFETY (NEEDLE) ×2 IMPLANT
NS IRRIG 1000ML POUR BTL (IV SOLUTION) ×2 IMPLANT
PACK BASIN DAY SURGERY FS (CUSTOM PROCEDURE TRAY) ×2 IMPLANT
PADDING CAST ABS 4INX4YD NS (CAST SUPPLIES)
PADDING CAST ABS COTTON 4X4 ST (CAST SUPPLIES) ×1 IMPLANT
STOCKINETTE 4X48 STRL (DRAPES) ×2 IMPLANT
SUT ETHILON 4 0 PS 2 18 (SUTURE) ×2 IMPLANT
SYR BULB EAR ULCER 3OZ GRN STR (SYRINGE) ×2 IMPLANT
SYR CONTROL 10ML LL (SYRINGE) ×2 IMPLANT
TOWEL GREEN STERILE FF (TOWEL DISPOSABLE) ×4 IMPLANT
UNDERPAD 30X36 HEAVY ABSORB (UNDERPADS AND DIAPERS) ×2 IMPLANT

## 2022-01-09 NOTE — Op Note (Addendum)
01/09/2022 [facility]                              OPERATIVE REPORT   PREOPERATIVE DIAGNOSIS:  Left carpal tunnel syndrome.  POSTOPERATIVE DIAGNOSIS:  Left carpal tunnel syndrome.  PROCEDURE:  Left carpal tunnel release.  SURGEON:  [name], MD  ASSISTANT:  none.  ANESTHESIA: Bier block with sedation  IV FLUIDS:  Per anesthesia flow sheet.  ESTIMATED BLOOD LOSS:  Minimal.  COMPLICATIONS:  None.  SPECIMENS:  None.  TOURNIQUET TIME:    Total Tourniquet Time Documented: Forearm (Left) - 24 minutes Total: Forearm (Left) - 24 minutes   DISPOSITION:  Stable to PACU.  LOCATION: [facility]  INDICATIONS:  76 yo female with numbness and tingling left hand.  Positive nerve conduction studies.  She wishes to have a carpal tunnel release for management of her symptoms.  Risks, benefits and alternatives of surgery were discussed including the risk of blood loss; infection; damage to nerves, vessels, tendons, ligaments, bone; failure of surgery; need for additional surgery; complications with wound healing; continued pain; recurrence of carpal tunnel syndrome; and damage to motor branch. She voiced understanding of these risks and elected to proceed.   OPERATIVE COURSE:  After being identified preoperatively by myself, the patient and I agreed upon the procedure and site of procedure.  The surgical site was marked.  Surgical consent had been signed.  She was given preoperative IV antibiotic prophylaxis.  She was transferred to the operating room and placed on the operating room table in supine position with the Left upper extremity on an armboard.  Bier block anesthesia was induced by the anesthesiologist.  Left upper extremity was prepped and draped in normal sterile orthopaedic fashion.  A surgical pause was performed between the surgeons, anesthesia, and operating room staff, and all were in agreement as to the patient, procedure, and site of procedure.   Tourniquet at the proximal aspect of the forearm had been inflated for the Bier block  Incision was made over the transverse carpal ligament and carried into the subcutaneous tissues by spreading technique.  Bipolar electrocautery was used to obtain hemostasis.  The palmar fascia was sharply incised.  The transverse carpal ligament was identified and sharply incised.  It was incised distally first.  The flexor tendons were identified.  The flexor tendon to the ring finger was identified and retracted radially.  The transverse carpal ligament was then incised proximally.  Scissors were used to split the distal aspect of the volar antebrachial fascia.  A finger was placed into the wound to ensure complete decompression, which was the case.  The nerve was examined.  It was flattened and hyperemic.  The motor branch was identified and was intact.  The wound was copiously irrigated with sterile saline.  It was then closed with 4-0 nylon in a horizontal mattress fashion.  It was injected with 0.25% plain Marcaine to aid in postoperative analgesia.  It was dressed with sterile Xeroform, 4x4s, an ABD, and wrapped with Kerlix and an Ace bandage.  Tourniquet was deflated at 24 minutes.  Fingertips were pink with brisk capillary refill after deflation of the tourniquet.  Operative drapes were broken down.  The patient was awoken from anesthesia safely.  She was transferred back to stretcher and taken to the PACU in stable condition.  I will see her back in the office in 1 week for postoperative followup.  I will give her a prescription for oxycodone 5 mg 1-2 po q6 hours prn pain, disp #10.    [name], MD Electronically signed, 01/09/22

## 2022-01-09 NOTE — Anesthesia Preprocedure Evaluation (Addendum)
Anesthesia Evaluation  Patient identified by MRN, date of birth, ID band Patient awake    Reviewed: Allergy & Precautions, NPO status , Patient's Chart, lab work & pertinent test results  History of Anesthesia Complications Negative for: history of anesthetic complications  Airway Mallampati: III  TM Distance: >3 FB Neck ROM: Full    Dental no notable dental hx. (+) Dental Advisory Given   Pulmonary neg pulmonary ROS,    Pulmonary exam normal        Cardiovascular hypertension, Pt. on home beta blockers and Pt. on medications Normal cardiovascular exam  Impressions:   - LVEF 60-65%, mild LVH, normal wall motion, diastolic dysfunction,  indeterminate LV filling pressure, normal LA size, trivial TR,  normal RVSP.    Neuro/Psych negative neurological ROS     GI/Hepatic negative GI ROS, Neg liver ROS,   Endo/Other  negative endocrine ROS  Renal/GU negative Renal ROS     Musculoskeletal  (+) Arthritis ,   Abdominal   Peds  Hematology negative hematology ROS (+)   Anesthesia Other Findings   Reproductive/Obstetrics                            Anesthesia Physical Anesthesia Plan  ASA: 2  Anesthesia Plan: Bier Block and MAC and Bier Block-LIDOCAINE ONLY   Post-op Pain Management: Tylenol PO (pre-op) and Celebrex PO (pre-op)   Induction:   PONV Risk Score and Plan: 2 and Ondansetron and Dexamethasone  Airway Management Planned: Natural Airway  Additional Equipment:   Intra-op Plan:   Post-operative Plan:   Informed Consent: I have reviewed the patients History and Physical, chart, labs and discussed the procedure including the risks, benefits and alternatives for the proposed anesthesia with the patient or authorized representative who has indicated his/her understanding and acceptance.     Dental advisory given  Plan Discussed with: Anesthesiologist and CRNA  Anesthesia  Plan Comments:        Anesthesia Quick Evaluation

## 2022-01-09 NOTE — Discharge Instructions (Addendum)

## 2022-01-09 NOTE — Anesthesia Postprocedure Evaluation (Signed)
Anesthesia Post Note  Patient: Nancy Yu  Procedure(s) Performed: LEFT CARPAL TUNNEL RELEASE (Left: Hand)     Patient location during evaluation: PACU Anesthesia Type: MAC Level of consciousness: awake and alert Pain management: pain level controlled Vital Signs Assessment: post-procedure vital signs reviewed and stable Respiratory status: spontaneous breathing and respiratory function stable Cardiovascular status: stable Postop Assessment: no apparent nausea or vomiting Anesthetic complications: no   No notable events documented.  Last Vitals:  Vitals:   01/09/22 1427 01/09/22 1452  BP:  (!) 160/62  Pulse: (!) 51 61  Resp: 10 20  Temp:  (!) 36.3 C  SpO2: 93% 95%    Last Pain:  Vitals:   01/09/22 1452  TempSrc: Temporal  PainSc: 0-No pain                 Pepper Kerrick DANIEL

## 2022-01-09 NOTE — Transfer of Care (Signed)
Immediate Anesthesia Transfer of Care Note  Patient: Nancy Yu  Procedure(s) Performed: LEFT CARPAL TUNNEL RELEASE (Left: Hand)  Patient Location: PACU  Anesthesia Type:MAC  Level of Consciousness: awake, alert  and oriented  Airway & Oxygen Therapy: Patient Spontanous Breathing  Post-op Assessment: Report given to RN and Post -op Vital signs reviewed and stable  Post vital signs: Reviewed and stable  Last Vitals:  Vitals Value Taken Time  BP 147/72 01/09/22 1406  Temp    Pulse 58 01/09/22 1406  Resp 15 01/09/22 1406  SpO2 92 % 01/09/22 1406    Last Pain:  Vitals:   01/09/22 1156  TempSrc: Oral  PainSc: 0-No pain         Complications: No notable events documented.

## 2022-01-09 NOTE — H&P (Signed)
Nancy Yu is an 76 y.o. female.   Chief Complaint: carpal tunnel syndrome HPI: 76 yo female with numbness and tingling left hand.  Positive nerve conduction studies.  She wishes to have left carpal tunnel release.  Allergies:  Allergies  Allergen Reactions   Statins     Muscle cramping    Past Medical History:  Diagnosis Date   Arthritis    Cataracts, bilateral    Hyperlipidemia    Hypertension    Migraines     Past Surgical History:  Procedure Laterality Date   ABDOMINAL HYSTERECTOMY     JOINT REPLACEMENT     L Hip   ROTATOR CUFF REPAIR     TONSILLECTOMY      Family History: Family History  Problem Relation Age of Onset   Cancer Mother        deceased 5   Cancer Father        deceased 84    Social History:   reports that she has never smoked. She has never used smokeless tobacco. She reports that she does not drink alcohol and does not use drugs.  Medications: Medications Prior to Admission  Medication Sig Dispense Refill   aspirin 81 MG tablet Take 81 mg by mouth daily.     Calcium Carbonate-Vitamin D (CALCIUM 600/VITAMIN D PO) Take 1 tablet by mouth daily.     carvedilol (COREG) 12.5 MG tablet Take 1 tablet (12.5 mg total) by mouth 2 (two) times daily. 60 tablet 11   diltiazem (CARDIZEM CD) 180 MG 24 hr capsule Take 1 capsule (180 mg total) by mouth daily. 90 capsule 3   fenofibrate 160 MG tablet Take 160 mg by mouth daily.     Omega-3 Fatty Acids (FISH OIL) 1000 MG CAPS Take 1,000 mg by mouth 2 (two) times daily.      valsartan (DIOVAN) 320 MG tablet Take 1 tablet (320 mg total) by mouth daily. 90 tablet 3    No results found for this or any previous visit (from the past 48 hour(s)).  No results found.    Blood pressure 138/65, pulse 63, temperature 97.8 F (36.6 C), temperature source Oral, resp. rate 18, height 5' 2.5" (1.588 m), weight 77.8 kg, SpO2 98 %.  General appearance: alert, cooperative, and appears stated age Head: Normocephalic,  without obvious abnormality, atraumatic Neck: supple, symmetrical, trachea midline Extremities: Intact sensation and capillary refill all digits.  +epl/fpl/io.  No wounds.  Pulses: 2+ and symmetric Skin: Skin color, texture, turgor normal. No rashes or lesions Neurologic: Grossly normal Incision/Wound: none  Assessment/Plan Left carpal tunnel syndrome.  Non operative and operative treatment options have been discussed with the patient and patient wishes to proceed with operative treatment. Risks, benefits, and alternatives of surgery have been discussed and the patient agrees with the plan of care.   Nancy Yu 01/09/2022, 1:13 PM

## 2022-01-10 ENCOUNTER — Encounter (HOSPITAL_BASED_OUTPATIENT_CLINIC_OR_DEPARTMENT_OTHER): Payer: Self-pay | Admitting: Orthopedic Surgery

## 2022-02-10 DIAGNOSIS — K219 Gastro-esophageal reflux disease without esophagitis: Secondary | ICD-10-CM | POA: Diagnosis not present

## 2022-02-10 DIAGNOSIS — R9431 Abnormal electrocardiogram [ECG] [EKG]: Secondary | ICD-10-CM | POA: Diagnosis not present

## 2022-02-10 DIAGNOSIS — R1013 Epigastric pain: Secondary | ICD-10-CM | POA: Diagnosis not present

## 2022-02-10 DIAGNOSIS — E785 Hyperlipidemia, unspecified: Secondary | ICD-10-CM | POA: Diagnosis not present

## 2022-02-10 DIAGNOSIS — R0789 Other chest pain: Secondary | ICD-10-CM | POA: Diagnosis not present

## 2022-02-10 DIAGNOSIS — R079 Chest pain, unspecified: Secondary | ICD-10-CM | POA: Diagnosis not present

## 2022-02-10 DIAGNOSIS — I10 Essential (primary) hypertension: Secondary | ICD-10-CM | POA: Diagnosis not present

## 2022-02-20 DIAGNOSIS — R1011 Right upper quadrant pain: Secondary | ICD-10-CM | POA: Diagnosis not present

## 2022-02-21 ENCOUNTER — Other Ambulatory Visit: Payer: Self-pay | Admitting: Family Medicine

## 2022-02-21 DIAGNOSIS — R1011 Right upper quadrant pain: Secondary | ICD-10-CM

## 2022-02-22 ENCOUNTER — Ambulatory Visit
Admission: RE | Admit: 2022-02-22 | Discharge: 2022-02-22 | Disposition: A | Discharge status: Home / Self Care | Payer: Medicare HMO | Source: Ambulatory Visit | Attending: Family Medicine | Admitting: Family Medicine

## 2022-02-22 DIAGNOSIS — K8020 Calculus of gallbladder without cholecystitis without obstruction: Secondary | ICD-10-CM | POA: Diagnosis not present

## 2022-02-22 DIAGNOSIS — R160 Hepatomegaly, not elsewhere classified: Secondary | ICD-10-CM | POA: Diagnosis not present

## 2022-02-22 DIAGNOSIS — R1011 Right upper quadrant pain: Secondary | ICD-10-CM

## 2022-03-24 DIAGNOSIS — L03115 Cellulitis of right lower limb: Secondary | ICD-10-CM | POA: Diagnosis not present

## 2022-03-31 DIAGNOSIS — L309 Dermatitis, unspecified: Secondary | ICD-10-CM | POA: Diagnosis not present

## 2022-03-31 DIAGNOSIS — E1169 Type 2 diabetes mellitus with other specified complication: Secondary | ICD-10-CM | POA: Diagnosis not present

## 2022-03-31 DIAGNOSIS — R1900 Intra-abdominal and pelvic swelling, mass and lump, unspecified site: Secondary | ICD-10-CM | POA: Diagnosis not present

## 2022-03-31 DIAGNOSIS — L03115 Cellulitis of right lower limb: Secondary | ICD-10-CM | POA: Diagnosis not present

## 2022-03-31 DIAGNOSIS — E782 Mixed hyperlipidemia: Secondary | ICD-10-CM | POA: Diagnosis not present

## 2022-04-04 DIAGNOSIS — K8020 Calculus of gallbladder without cholecystitis without obstruction: Secondary | ICD-10-CM | POA: Diagnosis not present

## 2022-04-04 DIAGNOSIS — R1013 Epigastric pain: Secondary | ICD-10-CM | POA: Diagnosis not present

## 2022-04-08 IMAGING — CR DG CHEST 2V
2 series · 2 of 2 positions shown · non-contrast
Comparison: 01/05/2016

CLINICAL DATA: Back pain, dyspnea

EXAM:
CHEST - 2 VIEW

[w chest pa]
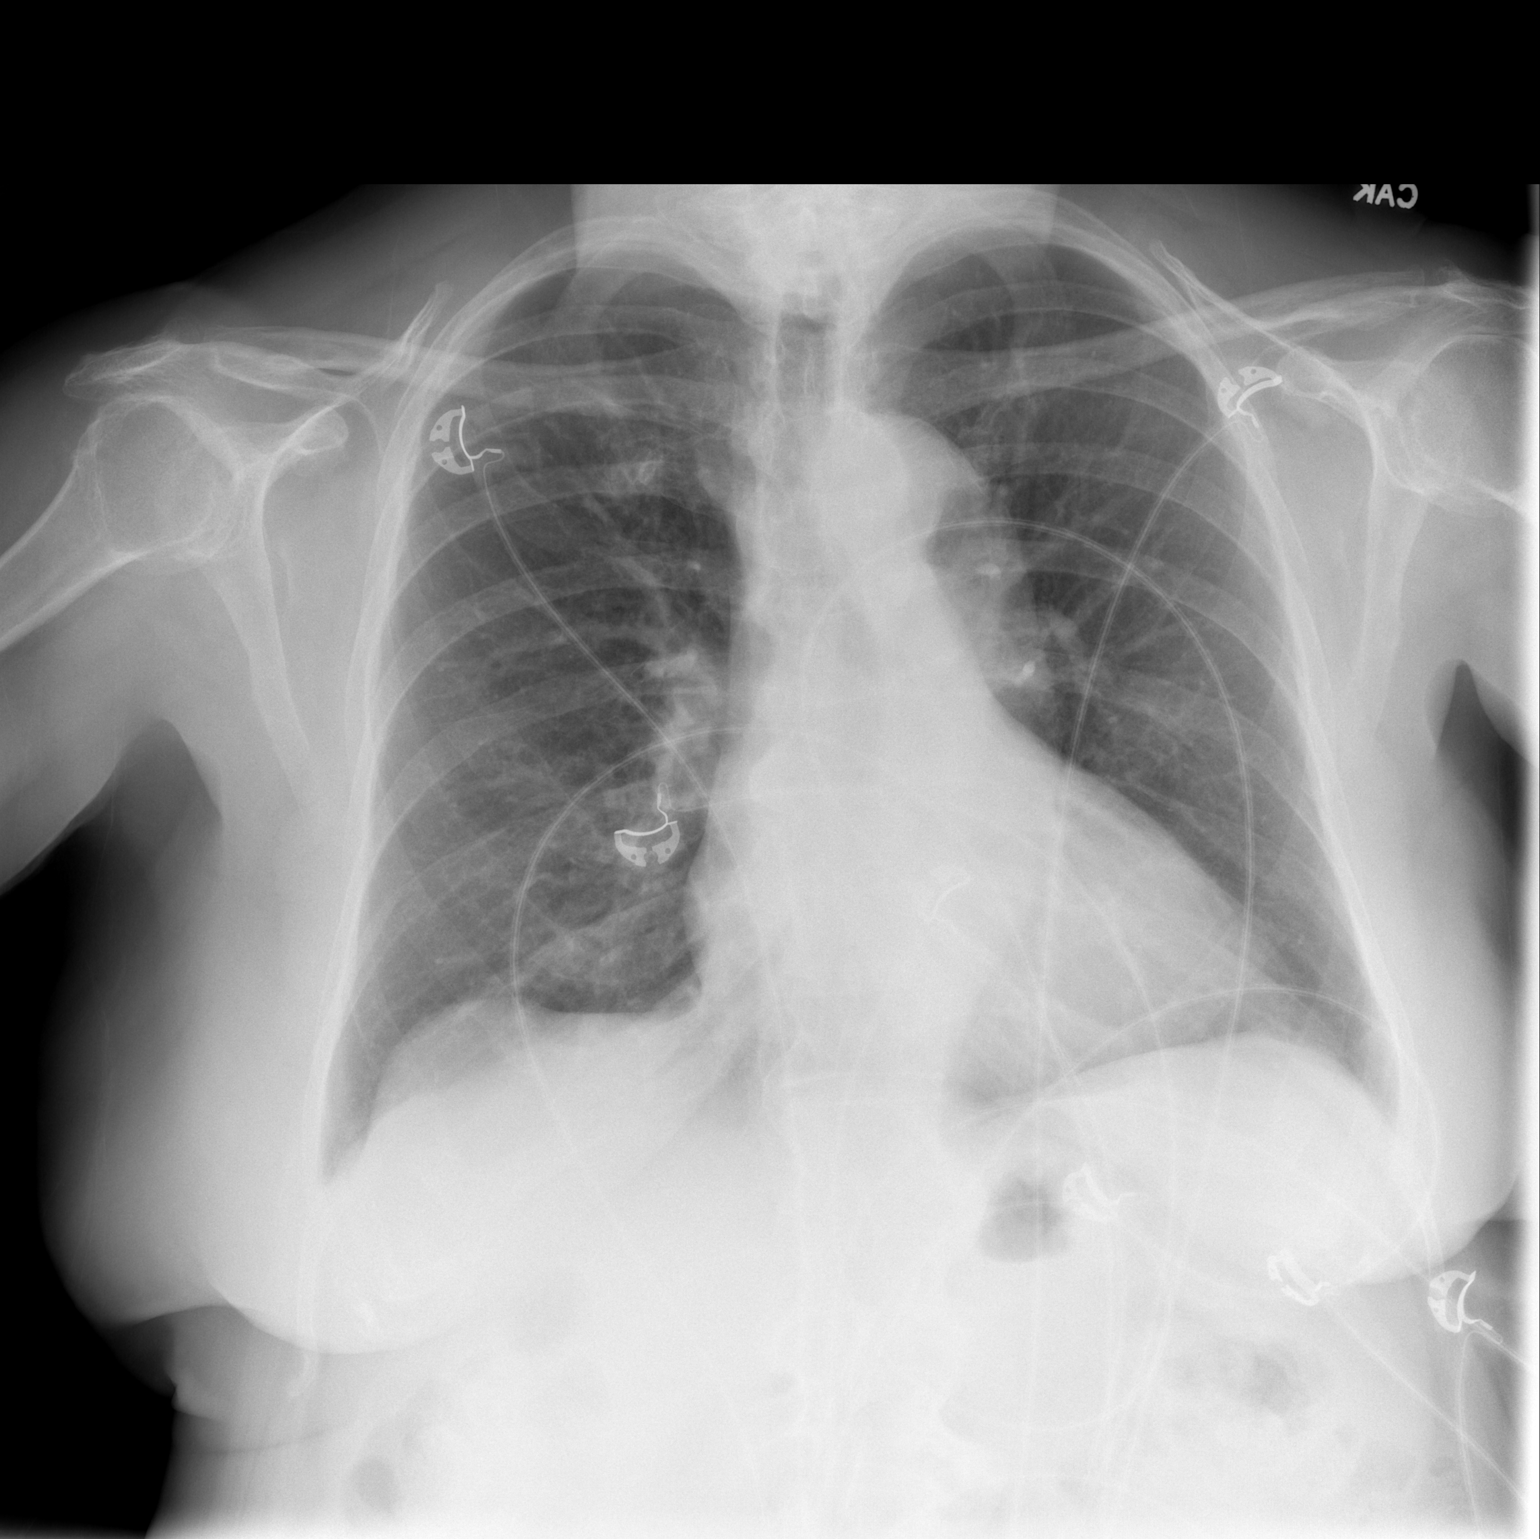

[w chest lat]
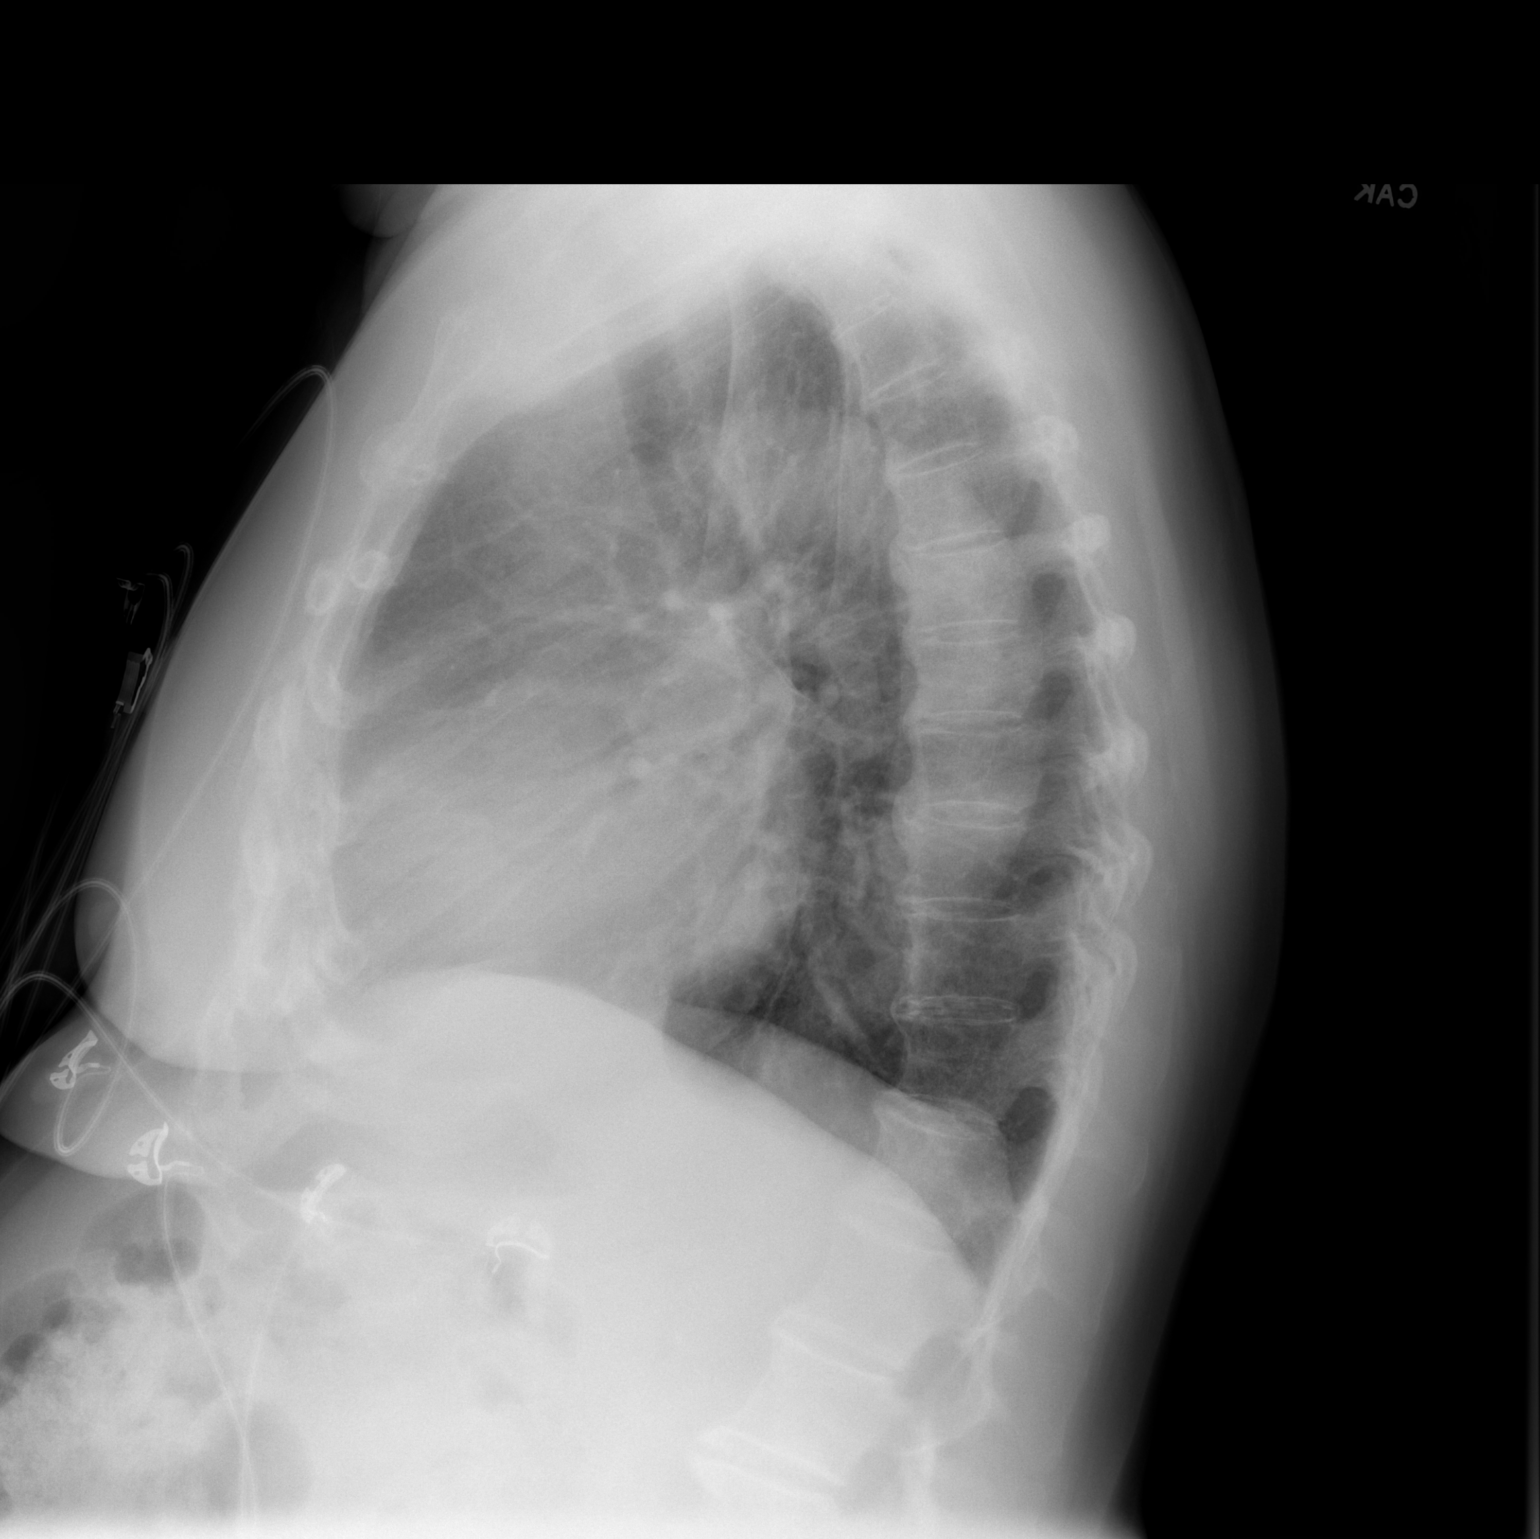

[2 of 2 positions shown; findings below may reference images not displayed]

FINDINGS: The lungs are well expanded, are symmetric, and are clear. No
pneumothorax or pleural effusion. Left atrial enlargement is
suspected though global cardiac size is within normal limits.
Pulmonary vascularity is normal. Degenerative changes are seen
within the thoracic spine. No acute bone abnormality.
IMPRESSION: No radiographic evidence of acute cardiopulmonary disease

## 2022-06-12 DIAGNOSIS — E7800 Pure hypercholesterolemia, unspecified: Secondary | ICD-10-CM | POA: Diagnosis not present

## 2022-06-12 DIAGNOSIS — H43391 Other vitreous opacities, right eye: Secondary | ICD-10-CM | POA: Diagnosis not present

## 2022-06-12 DIAGNOSIS — E1169 Type 2 diabetes mellitus with other specified complication: Secondary | ICD-10-CM | POA: Diagnosis not present

## 2022-08-01 ENCOUNTER — Encounter: Payer: Self-pay | Admitting: Cardiovascular Disease

## 2022-08-01 ENCOUNTER — Ambulatory Visit: Payer: Medicare HMO | Attending: Cardiovascular Disease | Admitting: Cardiovascular Disease

## 2022-08-01 VITALS — BP 138/76 | HR 60 | Ht 62.0 in | Wt 165.0 lb

## 2022-08-01 DIAGNOSIS — I10 Essential (primary) hypertension: Secondary | ICD-10-CM

## 2022-08-01 DIAGNOSIS — I493 Ventricular premature depolarization: Secondary | ICD-10-CM | POA: Diagnosis not present

## 2022-08-01 MED ORDER — DILTIAZEM HCL ER COATED BEADS 180 MG PO CP24: 180.0000 mg | ORAL_CAPSULE | Freq: Every day | ORAL | 3 refills | Status: DC

## 2022-08-01 MED ORDER — CARVEDILOL 12.5 MG PO TABS: 12.5000 mg | ORAL_TABLET | Freq: Two times a day (BID) | ORAL | 11 refills | Status: DC

## 2022-08-01 NOTE — Patient Instructions (Signed)
Medication Instructions:  The current medical regimen is effective;  continue present plan and medications.  Refills were sent to your pharmacy.   *If you need a refill on your cardiac medications before your next appointment, please call your pharmacy*   Follow-Up: At [facility], you and your health needs are our priority.  As part of our continuing mission to provide you with exceptional heart care, we have created designated Provider Care Teams.  These Care Teams include your primary Cardiologist (physician) and Advanced Practice Providers (APPs -  Physician Assistants and Nurse Practitioners) who all work together to provide you with the care you need, when you need it.  We recommend signing up for the patient portal called "MyChart".  Sign up information is provided on this After Visit Summary.  MyChart is used to connect with patients for Virtual Visits (Telemedicine).  Patients are able to view lab/test results, encounter notes, upcoming appointments, etc.  Non-urgent messages can be sent to your provider as well.   To learn more about what you can do with MyChart, go to [email or web address].    Your next appointment:   12 month(s)  The format for your next appointment:   In Person  Provider:   Kathlyn Sacramento, MD

## 2022-08-01 NOTE — Progress Notes (Signed)
Cardiology Office Note   Date:  08/01/2022   ID:  ISA HITZ, DOB [birth year], MRN [id number]  PCP:  [facility]  Cardiologist:   Kathlyn Sacramento, MD   No chief complaint on file.      History of Present Illness: Nancy Yu is a 76 y.o. female who presents for a follow up visit regarding symptomatic PVCs and essential hypertension..  She has known history of elevated triglyceride with borderline diabetes. She is not a smoker and has no family history of coronary artery disease. She had cardiac work up in 02/2016 which included:  A normal nuclear stress test . A Holter monitor showed 7000 PVC (5%). Echo was unremarkable.  She was treated with diltiazem with improvement.  She had previous right hip replacement.  She underwent left carpal tunnel surgery in February.  She had issues of abdominal pain in March while in [place].  Symptoms were thought to be due to gallbladder disease versus GERD.  Her symptoms did not last long and did not require intervention.  She has been doing well with no chest pain, shortness of breath or palpitations.  No dizziness, syncope or presyncope.   Past Medical History:  Diagnosis Date   Arthritis    Cataracts, bilateral    Hyperlipidemia    Hypertension    Migraines     Past Surgical History:  Procedure Laterality Date   ABDOMINAL HYSTERECTOMY     CARPAL TUNNEL RELEASE Left 01/09/2022   Procedure: LEFT CARPAL TUNNEL RELEASE;  Surgeon: [name], MD;  Location: [facility];  Service: Orthopedics;  Laterality: Left;  Bier block   JOINT REPLACEMENT     L Hip   ROTATOR CUFF REPAIR     TONSILLECTOMY       Current Outpatient Medications  Medication Sig Dispense Refill   aspirin 81 MG tablet Take 81 mg by mouth daily.     Calcium Carbonate-Vitamin D (CALCIUM 600/VITAMIN D PO) Take 1 tablet by mouth daily.     carvedilol (COREG) 12.5 MG tablet Take 1 tablet (12.5 mg total) by mouth 2 (two)  times daily. 60 tablet 11   diltiazem (CARDIZEM CD) 180 MG 24 hr capsule Take 1 capsule (180 mg total) by mouth daily. 90 capsule 3   fenofibrate 160 MG tablet Take 160 mg by mouth daily.     Omega-3 Fatty Acids (FISH OIL) 1000 MG CAPS Take 1,000 mg by mouth 2 (two) times daily.      oxyCODONE (ROXICODONE) 5 MG immediate release tablet 1-2 tabs PO q6 hours prn pain 10 tablet 0   valsartan (DIOVAN) 320 MG tablet Take 1 tablet (320 mg total) by mouth daily. 90 tablet 3   No current facility-administered medications for this visit.    Allergies:   Statins    Social History:  The patient  reports that she has never smoked. She has never used smokeless tobacco. She reports that she does not drink alcohol and does not use drugs.   Family History:  The patient's family history includes Cancer in her father and mother.    ROS:  Please see the history of present illness.   Otherwise, review of systems are positive for none.   All other systems are reviewed and negative.    PHYSICAL EXAM: VS:  There were no vitals taken for this visit. , BMI There is no height or weight on file to calculate BMI. GEN: Well nourished, well developed, in no  acute distress  HEENT: normal  Neck: no JVD, carotid bruits, or masses Cardiac: RRR; no murmurs, rubs, or gallops,no edema  Respiratory:  clear to auscultation bilaterally, normal work of breathing GI: soft, nontender, nondistended, + BS MS: no deformity or atrophy  Skin: warm and dry, no rash Neuro:  Strength and sensation are intact Psych: euthymic mood, full affect Distal pulses are normal.   EKG:  EKG is ordered today. EKG showed sinus rhythm with first-degree AV block, left anterior fascicular block and chronic anterolateral T wave changes suggestive of ischemia.   Recent Labs: No results found for requested labs within last 365 days.    Lipid Panel No results found for: "CHOL", "TRIG", "HDL", "CHOLHDL", "VLDL", "LDLCALC", "LDLDIRECT"    Wt  Readings from Last 3 Encounters:  01/09/22 171 lb 8.3 oz (77.8 kg)  08/02/21 168 lb (76.2 kg)  07/20/20 165 lb (74.8 kg)        ASSESSMENT AND PLAN:  1. Symptomatic PVCs: These are well controlled with carvedilol and diltiazem with no symptoms related to this and no evidence of recurrent premature beats on EKG.  I refilled diltiazem.   2. Essential hypertension: Blood pressure is well controlled on valsartan, diltiazem and carvedilol.  I refilled carvedilol.  I reviewed most recent labs done in March which overall were unremarkable with normal renal function and electrolytes.    Disposition:  follow-up in 12 months.  Signed,  Kathlyn Sacramento, MD  08/01/2022 8:04 AM    [facility]

## 2022-10-12 DIAGNOSIS — H5212 Myopia, left eye: Secondary | ICD-10-CM | POA: Diagnosis not present

## 2022-10-12 DIAGNOSIS — Z0100 Encounter for examination of eyes and vision without abnormal findings: Secondary | ICD-10-CM | POA: Diagnosis not present

## 2022-11-10 ENCOUNTER — Other Ambulatory Visit: Payer: Self-pay | Admitting: Cardiovascular Disease

## 2022-11-10 NOTE — Telephone Encounter (Signed)
Refill request

## 2022-11-29 ENCOUNTER — Encounter: Payer: Self-pay | Admitting: Family Medicine

## 2022-11-29 ENCOUNTER — Other Ambulatory Visit: Payer: Self-pay | Admitting: Family Medicine

## 2022-11-29 DIAGNOSIS — Z1231 Encounter for screening mammogram for malignant neoplasm of breast: Secondary | ICD-10-CM

## 2022-12-22 DIAGNOSIS — Z0000 Encounter for general adult medical examination without abnormal findings: Secondary | ICD-10-CM | POA: Diagnosis not present

## 2022-12-22 DIAGNOSIS — E1121 Type 2 diabetes mellitus with diabetic nephropathy: Secondary | ICD-10-CM | POA: Diagnosis not present

## 2022-12-22 DIAGNOSIS — I493 Ventricular premature depolarization: Secondary | ICD-10-CM | POA: Diagnosis not present

## 2022-12-22 DIAGNOSIS — E782 Mixed hyperlipidemia: Secondary | ICD-10-CM | POA: Diagnosis not present

## 2022-12-22 DIAGNOSIS — N8110 Cystocele, unspecified: Secondary | ICD-10-CM | POA: Diagnosis not present

## 2022-12-22 DIAGNOSIS — R69 Illness, unspecified: Secondary | ICD-10-CM | POA: Diagnosis not present

## 2022-12-22 DIAGNOSIS — I10 Essential (primary) hypertension: Secondary | ICD-10-CM | POA: Diagnosis not present

## 2022-12-22 DIAGNOSIS — E1169 Type 2 diabetes mellitus with other specified complication: Secondary | ICD-10-CM | POA: Diagnosis not present

## 2022-12-22 DIAGNOSIS — E2839 Other primary ovarian failure: Secondary | ICD-10-CM | POA: Diagnosis not present

## 2022-12-22 DIAGNOSIS — E559 Vitamin D deficiency, unspecified: Secondary | ICD-10-CM | POA: Diagnosis not present

## 2022-12-22 DIAGNOSIS — Z1211 Encounter for screening for malignant neoplasm of colon: Secondary | ICD-10-CM | POA: Diagnosis not present

## 2022-12-25 ENCOUNTER — Other Ambulatory Visit: Payer: Self-pay | Admitting: Family Medicine

## 2022-12-25 DIAGNOSIS — E2839 Other primary ovarian failure: Secondary | ICD-10-CM

## 2023-01-16 DIAGNOSIS — Z1211 Encounter for screening for malignant neoplasm of colon: Secondary | ICD-10-CM | POA: Diagnosis not present

## 2023-01-17 ENCOUNTER — Ambulatory Visit: Payer: Medicare HMO

## 2023-01-19 ENCOUNTER — Ambulatory Visit
Admission: RE | Admit: 2023-01-19 | Discharge: 2023-01-19 | Disposition: A | Discharge status: Home / Self Care | Payer: Medicare HMO | Source: Ambulatory Visit | Attending: Family Medicine | Admitting: Family Medicine

## 2023-01-19 DIAGNOSIS — Z1231 Encounter for screening mammogram for malignant neoplasm of breast: Secondary | ICD-10-CM

## 2023-01-30 ENCOUNTER — Ambulatory Visit
Admission: RE | Admit: 2023-01-30 | Discharge: 2023-01-30 | Disposition: A | Discharge status: Home / Self Care | Payer: Medicare HMO | Source: Ambulatory Visit | Attending: Family Medicine | Admitting: Family Medicine

## 2023-01-30 DIAGNOSIS — E2839 Other primary ovarian failure: Secondary | ICD-10-CM

## 2023-01-30 DIAGNOSIS — Z780 Asymptomatic menopausal state: Secondary | ICD-10-CM | POA: Diagnosis not present

## 2023-01-30 DIAGNOSIS — M8589 Other specified disorders of bone density and structure, multiple sites: Secondary | ICD-10-CM | POA: Diagnosis not present

## 2023-02-06 DIAGNOSIS — I10 Essential (primary) hypertension: Secondary | ICD-10-CM | POA: Diagnosis not present

## 2023-02-06 DIAGNOSIS — Z03818 Encounter for observation for suspected exposure to other biological agents ruled out: Secondary | ICD-10-CM | POA: Diagnosis not present

## 2023-02-06 DIAGNOSIS — R0981 Nasal congestion: Secondary | ICD-10-CM | POA: Diagnosis not present

## 2023-02-06 DIAGNOSIS — H6690 Otitis media, unspecified, unspecified ear: Secondary | ICD-10-CM | POA: Diagnosis not present

## 2023-02-06 DIAGNOSIS — H6591 Unspecified nonsuppurative otitis media, right ear: Secondary | ICD-10-CM | POA: Diagnosis not present

## 2023-02-25 DIAGNOSIS — R300 Dysuria: Secondary | ICD-10-CM | POA: Diagnosis not present

## 2023-02-25 DIAGNOSIS — N3001 Acute cystitis with hematuria: Secondary | ICD-10-CM | POA: Diagnosis not present

## 2023-05-09 DIAGNOSIS — E1121 Type 2 diabetes mellitus with diabetic nephropathy: Secondary | ICD-10-CM | POA: Diagnosis not present

## 2023-05-09 DIAGNOSIS — E6609 Other obesity due to excess calories: Secondary | ICD-10-CM | POA: Diagnosis not present

## 2023-05-09 DIAGNOSIS — Z6831 Body mass index (BMI) 31.0-31.9, adult: Secondary | ICD-10-CM | POA: Diagnosis not present

## 2023-05-09 DIAGNOSIS — I10 Essential (primary) hypertension: Secondary | ICD-10-CM | POA: Diagnosis not present

## 2023-05-09 DIAGNOSIS — E782 Mixed hyperlipidemia: Secondary | ICD-10-CM | POA: Diagnosis not present

## 2023-06-11 DIAGNOSIS — L821 Other seborrheic keratosis: Secondary | ICD-10-CM | POA: Diagnosis not present

## 2023-06-11 DIAGNOSIS — Z85828 Personal history of other malignant neoplasm of skin: Secondary | ICD-10-CM | POA: Diagnosis not present

## 2023-06-11 DIAGNOSIS — L918 Other hypertrophic disorders of the skin: Secondary | ICD-10-CM | POA: Diagnosis not present

## 2023-06-11 DIAGNOSIS — D1801 Hemangioma of skin and subcutaneous tissue: Secondary | ICD-10-CM | POA: Diagnosis not present

## 2023-06-11 DIAGNOSIS — L308 Other specified dermatitis: Secondary | ICD-10-CM | POA: Diagnosis not present

## 2023-06-11 DIAGNOSIS — L817 Pigmented purpuric dermatosis: Secondary | ICD-10-CM | POA: Diagnosis not present

## 2023-06-11 DIAGNOSIS — L245 Irritant contact dermatitis due to other chemical products: Secondary | ICD-10-CM | POA: Diagnosis not present

## 2023-06-11 DIAGNOSIS — D225 Melanocytic nevi of trunk: Secondary | ICD-10-CM | POA: Diagnosis not present

## 2023-06-11 DIAGNOSIS — L570 Actinic keratosis: Secondary | ICD-10-CM | POA: Diagnosis not present

## 2023-07-24 ENCOUNTER — Other Ambulatory Visit: Payer: Self-pay | Admitting: Cardiovascular Disease

## 2023-07-24 DIAGNOSIS — I493 Ventricular premature depolarization: Secondary | ICD-10-CM

## 2023-07-29 ENCOUNTER — Other Ambulatory Visit: Payer: Self-pay | Admitting: Cardiovascular Disease

## 2023-07-29 DIAGNOSIS — I493 Ventricular premature depolarization: Secondary | ICD-10-CM

## 2023-07-30 NOTE — Progress Notes (Unsigned)
Cardiology Office Note   Date:  07/31/2023   ID:  Nancy Yu, DOB [birth year], MRN [id number]  PCP:  [facility]  Cardiologist:   Lorine Bears, MD   No chief complaint on file.      History of Present Illness: Nancy Yu is a 77 y.o. female who presents for a follow up visit regarding symptomatic PVCs and essential hypertension..  She has known history of elevated triglyceride with borderline diabetes. She is not a smoker and has no family history of coronary artery disease. She had cardiac work up in 02/2016 which included:  A normal nuclear stress test . A Holter monitor showed 7000 PVC (5%). Echo was unremarkable.  She was treated with diltiazem with improvement.  She had previous right hip replacement and left carpal tunnel surgery .  She has been doing very well with no chest pain, shortness of breath or palpitations.  She takes her medications regularly.  She is in the process of selling her house and moving to live closer to her daughter in [place].  Past Medical History:  Diagnosis Date   Arthritis    Cataracts, bilateral    Hyperlipidemia    Hypertension    Migraines     Past Surgical History:  Procedure Laterality Date   ABDOMINAL HYSTERECTOMY     CARPAL TUNNEL RELEASE Left 01/09/2022   Procedure: LEFT CARPAL TUNNEL RELEASE;  Surgeon: Betha Loa, MD;  Location: [facility];  Service: Orthopedics;  Laterality: Left;  Bier block   JOINT REPLACEMENT     L Hip   ROTATOR CUFF REPAIR     TONSILLECTOMY       Current Outpatient Medications  Medication Sig Dispense Refill   aspirin 81 MG tablet Take 81 mg by mouth daily.     Calcium Carbonate-Vitamin D (CALCIUM 600/VITAMIN D PO) Take 1 tablet by mouth daily.     carvedilol (COREG) 12.5 MG tablet TAKE 1 TABLET BY MOUTH 2 TIMES DAILY. 60 tablet 0   diltiazem (CARDIZEM CD) 180 MG 24 hr capsule Take 1 capsule (180 mg total) by mouth daily. 90 capsule 3    fenofibrate 160 MG tablet Take 160 mg by mouth daily.     Omega-3 Fatty Acids (FISH OIL) 1000 MG CAPS Take 1,000 mg by mouth 2 (two) times daily.      oxyCODONE (ROXICODONE) 5 MG immediate release tablet 1-2 tabs PO q6 hours prn pain 10 tablet 0   valsartan (DIOVAN) 320 MG tablet TAKE 1 TABLET BY MOUTH EVERY DAY 90 tablet 3   No current facility-administered medications for this visit.    Allergies:   Statins    Social History:  The patient  reports that she has never smoked. She has never used smokeless tobacco. She reports that she does not drink alcohol and does not use drugs.   Family History:  The patient's family history includes Cancer in her father and mother.    ROS:  Please see the history of present illness.   Otherwise, review of systems are positive for none.   All other systems are reviewed and negative.    PHYSICAL EXAM: VS:  BP 128/86   Pulse 62   Ht 5\' 2"  (1.575 m)   Wt 163 lb 9.6 oz (74.2 kg)   SpO2 96%   BMI 29.92 kg/m  , BMI Body mass index is 29.92 kg/m. GEN: Well nourished, well developed, in no acute distress  HEENT: normal  Neck: no JVD, carotid bruits, or masses Cardiac: RRR; no murmurs, rubs, or gallops,no edema  Respiratory:  clear to auscultation bilaterally, normal work of breathing GI: soft, nontender, nondistended, + BS MS: no deformity or atrophy  Skin: warm and dry, no rash Neuro:  Strength and sensation are intact Psych: euthymic mood, full affect Distal pulses are normal.   EKG:  EKG is ordered today. EKG showed : Sinus bradycardia with 1st degree A-V block with occasional Premature ventricular complexes Incomplete right bundle branch block       Recent Labs: No results found for requested labs within last 365 days.    Lipid Panel No results found for: "CHOL", "TRIG", "HDL", "CHOLHDL", "VLDL", "LDLCALC", "LDLDIRECT"    Wt Readings from Last 3 Encounters:  07/31/23 163 lb 9.6 oz (74.2 kg)  08/01/22 165 lb (74.8 kg)   01/09/22 171 lb 8.3 oz (77.8 kg)        ASSESSMENT AND PLAN:  1. Symptomatic PVCs: These are well controlled with carvedilol and diltiazem with no symptoms related to this and no evidence of recurrent premature beats on EKG.  I refilled both diltiazem and carvedilol.   2. Essential hypertension: Blood pressure is well controlled on valsartan, diltiazem and carvedilol.  Valsartan was refilled today.   Disposition:  follow-up in 12 months.  Signed,  Lorine Bears, MD  07/31/2023 10:31 AM    [facility]

## 2023-07-30 NOTE — Telephone Encounter (Signed)
Refill Request.  

## 2023-07-31 ENCOUNTER — Encounter: Payer: Self-pay | Admitting: Cardiovascular Disease

## 2023-07-31 ENCOUNTER — Ambulatory Visit: Payer: Medicare HMO | Attending: Cardiovascular Disease | Admitting: Cardiovascular Disease

## 2023-07-31 VITALS — BP 128/86 | HR 62 | Ht 62.0 in | Wt 163.6 lb

## 2023-07-31 DIAGNOSIS — I493 Ventricular premature depolarization: Secondary | ICD-10-CM

## 2023-07-31 DIAGNOSIS — I10 Essential (primary) hypertension: Secondary | ICD-10-CM | POA: Diagnosis not present

## 2023-07-31 MED ORDER — CARVEDILOL 12.5 MG PO TABS: 12.5000 mg | ORAL_TABLET | Freq: Two times a day (BID) | ORAL | 3 refills | Status: DC

## 2023-07-31 MED ORDER — VALSARTAN 320 MG PO TABS
320.0000 mg | ORAL_TABLET | Freq: Every day | ORAL | 3 refills | Status: AC
Start: 2023-07-31 — End: ?

## 2023-07-31 MED ORDER — DILTIAZEM HCL ER COATED BEADS 180 MG PO CP24
180.0000 mg | ORAL_CAPSULE | Freq: Every day | ORAL | 3 refills | Status: AC
Start: 2023-07-31 — End: ?

## 2023-07-31 NOTE — Patient Instructions (Signed)
Medication Instructions:  No changes *If you need a refill on your cardiac medications before your next appointment, please call your pharmacy*   Lab Work: None ordered If you have labs (blood work) drawn today and your tests are completely normal, you will receive your results only by: MyChart Message (if you have MyChart) OR A paper copy in the mail If you have any lab test that is abnormal or we need to change your treatment, we will call you to review the results.   Testing/Procedures: None ordered   Follow-Up: At [facility], you and your health needs are our priority.  As part of our continuing mission to provide you with exceptional heart care, we have created designated Provider Care Teams.  These Care Teams include your primary Cardiologist (physician) and Advanced Practice Providers (APPs -  Physician Assistants and Nurse Practitioners) who all work together to provide you with the care you need, when you need it.  We recommend signing up for the patient portal called "MyChart".  Sign up information is provided on this After Visit Summary.  MyChart is used to connect with patients for Virtual Visits (Telemedicine).  Patients are able to view lab/test results, encounter notes, upcoming appointments, etc.  Non-urgent messages can be sent to your provider as well.   To learn more about what you can do with MyChart, go to [email or web address].    Your next appointment:   12 month(s)  Provider:   Muhammad Arida, MD       

## 2023-09-28 DIAGNOSIS — L0390 Cellulitis, unspecified: Secondary | ICD-10-CM | POA: Diagnosis not present

## 2023-10-05 DIAGNOSIS — Z1330 Encounter for screening examination for mental health and behavioral disorders, unspecified: Secondary | ICD-10-CM | POA: Diagnosis not present

## 2023-10-05 DIAGNOSIS — I10 Essential (primary) hypertension: Secondary | ICD-10-CM | POA: Diagnosis not present

## 2023-10-05 DIAGNOSIS — Z0000 Encounter for general adult medical examination without abnormal findings: Secondary | ICD-10-CM | POA: Diagnosis not present

## 2023-10-05 DIAGNOSIS — L03116 Cellulitis of left lower limb: Secondary | ICD-10-CM | POA: Diagnosis not present

## 2023-10-05 DIAGNOSIS — S8992XA Unspecified injury of left lower leg, initial encounter: Secondary | ICD-10-CM | POA: Diagnosis not present

## 2023-10-19 DIAGNOSIS — X58XXXA Exposure to other specified factors, initial encounter: Secondary | ICD-10-CM | POA: Diagnosis not present

## 2023-10-19 DIAGNOSIS — S81802A Unspecified open wound, left lower leg, initial encounter: Secondary | ICD-10-CM | POA: Diagnosis not present

## 2023-10-19 DIAGNOSIS — S81812A Laceration without foreign body, left lower leg, initial encounter: Secondary | ICD-10-CM | POA: Diagnosis not present

## 2023-10-19 DIAGNOSIS — M79605 Pain in left leg: Secondary | ICD-10-CM | POA: Diagnosis not present

## 2023-10-19 DIAGNOSIS — L089 Local infection of the skin and subcutaneous tissue, unspecified: Secondary | ICD-10-CM | POA: Diagnosis not present

## 2023-10-20 DIAGNOSIS — S80819A Abrasion, unspecified lower leg, initial encounter: Secondary | ICD-10-CM | POA: Diagnosis not present

## 2023-11-02 DIAGNOSIS — L03116 Cellulitis of left lower limb: Secondary | ICD-10-CM | POA: Diagnosis not present

## 2023-11-02 DIAGNOSIS — S81802D Unspecified open wound, left lower leg, subsequent encounter: Secondary | ICD-10-CM | POA: Diagnosis not present

## 2023-11-02 DIAGNOSIS — Z1330 Encounter for screening examination for mental health and behavioral disorders, unspecified: Secondary | ICD-10-CM | POA: Diagnosis not present

## 2023-11-02 DIAGNOSIS — S8992XA Unspecified injury of left lower leg, initial encounter: Secondary | ICD-10-CM | POA: Diagnosis not present

## 2023-11-02 DIAGNOSIS — Z0000 Encounter for general adult medical examination without abnormal findings: Secondary | ICD-10-CM | POA: Diagnosis not present

## 2023-11-02 DIAGNOSIS — I10 Essential (primary) hypertension: Secondary | ICD-10-CM | POA: Diagnosis not present

## 2023-11-08 DIAGNOSIS — L03116 Cellulitis of left lower limb: Secondary | ICD-10-CM | POA: Diagnosis not present

## 2023-11-08 DIAGNOSIS — Z1330 Encounter for screening examination for mental health and behavioral disorders, unspecified: Secondary | ICD-10-CM | POA: Diagnosis not present

## 2023-11-08 DIAGNOSIS — Z0000 Encounter for general adult medical examination without abnormal findings: Secondary | ICD-10-CM | POA: Diagnosis not present

## 2023-11-08 DIAGNOSIS — S81802D Unspecified open wound, left lower leg, subsequent encounter: Secondary | ICD-10-CM | POA: Diagnosis not present

## 2023-11-08 DIAGNOSIS — I10 Essential (primary) hypertension: Secondary | ICD-10-CM | POA: Diagnosis not present

## 2023-11-08 DIAGNOSIS — S8992XA Unspecified injury of left lower leg, initial encounter: Secondary | ICD-10-CM | POA: Diagnosis not present

## 2023-11-08 IMAGING — MG MM DIGITAL SCREENING BILAT W/ TOMO AND CAD
6 of 10 series · 6 of 30 positions shown · non-contrast
Comparison: Previous exam(s).

CLINICAL DATA: Screening.

EXAM:
DIGITAL SCREENING BILATERAL MAMMOGRAM WITH TOMOSYNTHESIS AND CAD
TECHNIQUE: Bilateral screening digital craniocaudal and mediolateral oblique
mammograms were obtained. Bilateral screening digital breast
tomosynthesis was performed. The images were evaluated with
computer-aided detection.

[R MLO synth-2D (1 of 2)]
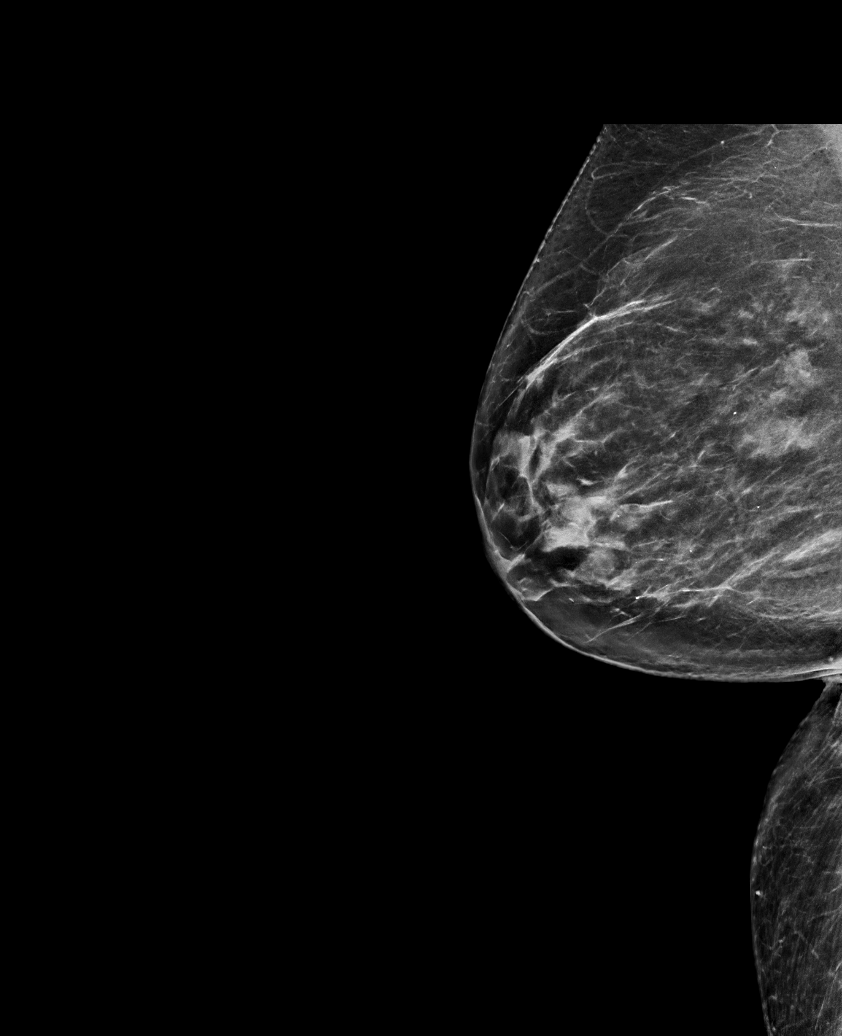

[R CC synth-2D]
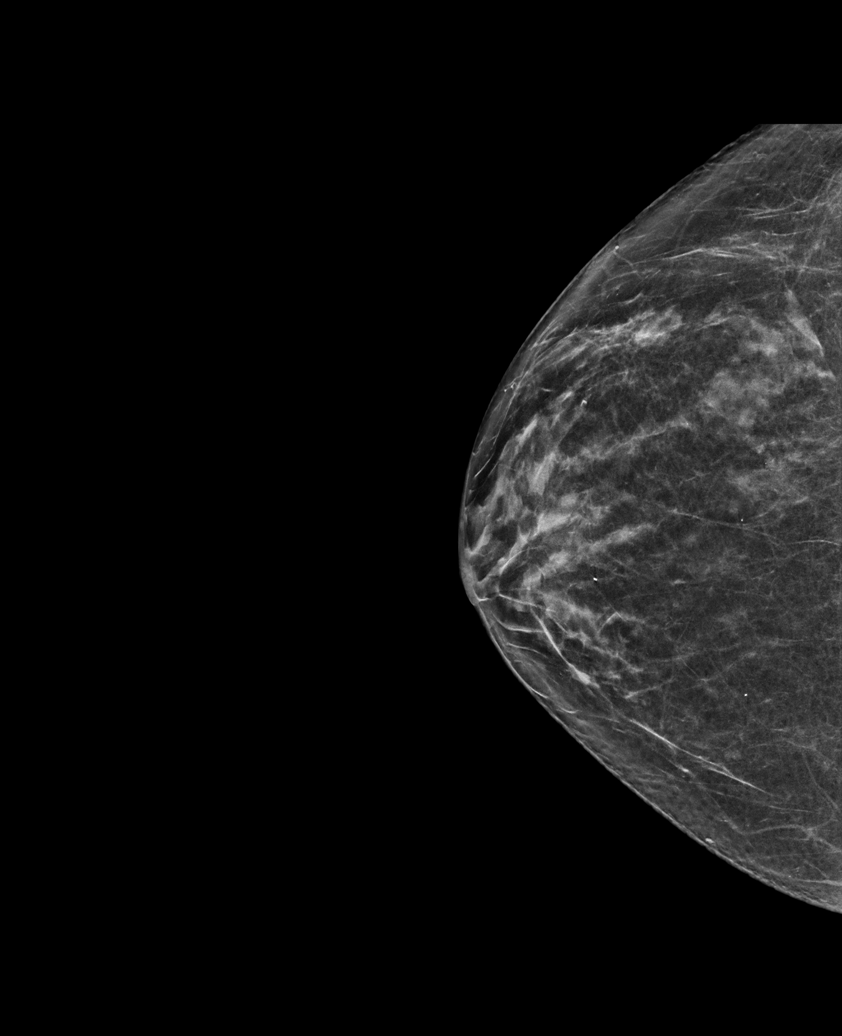

[R MLO synth-2D (2 of 2)]
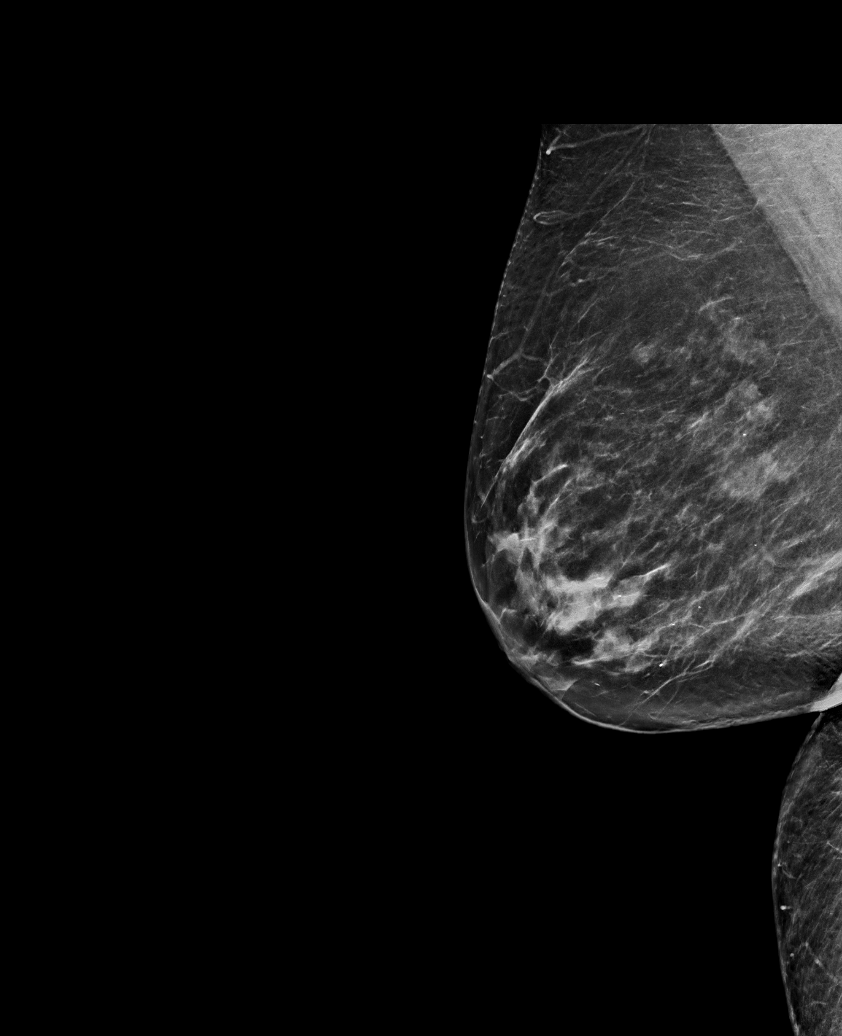

[L CC synth-2D]
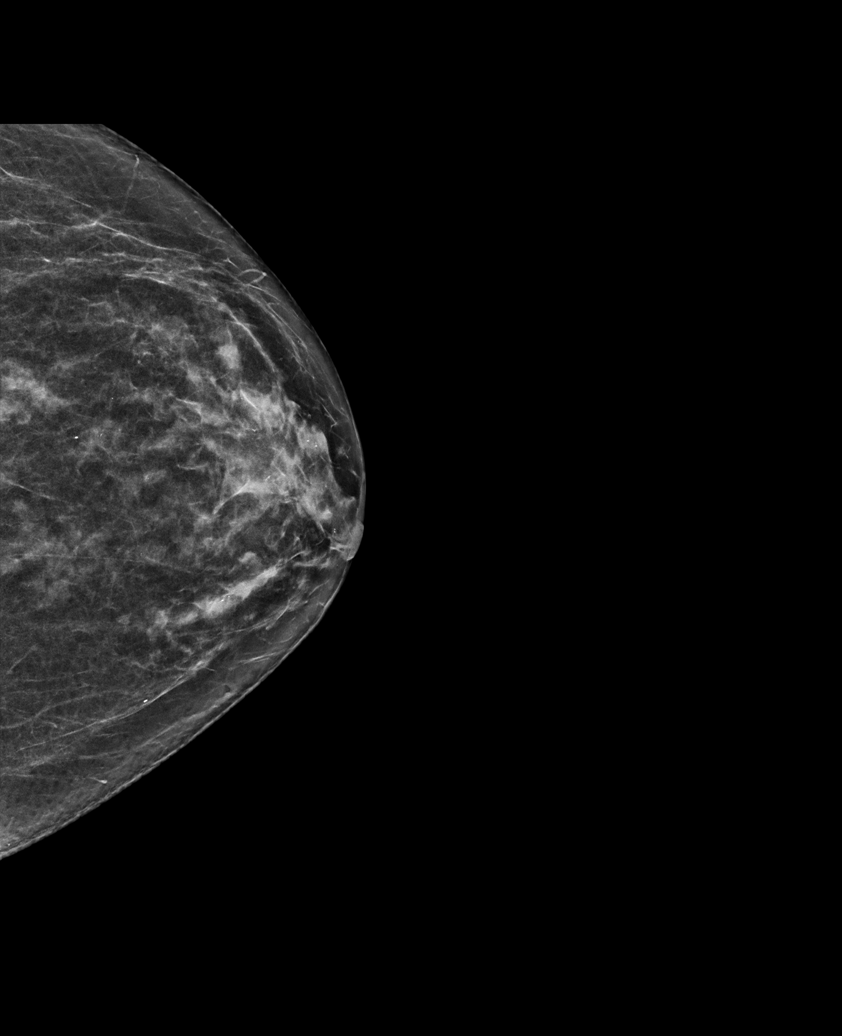

[L MLO synth-2D]
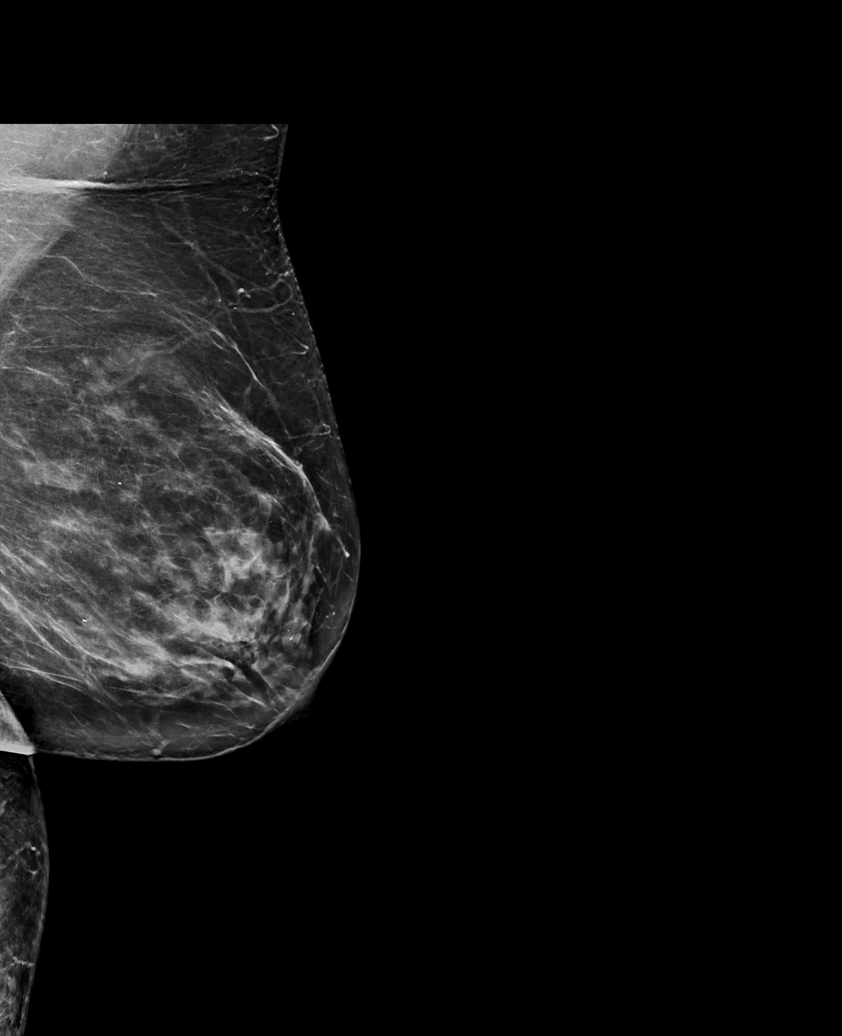

[L CC tomo · tomo slice 31/61.0]
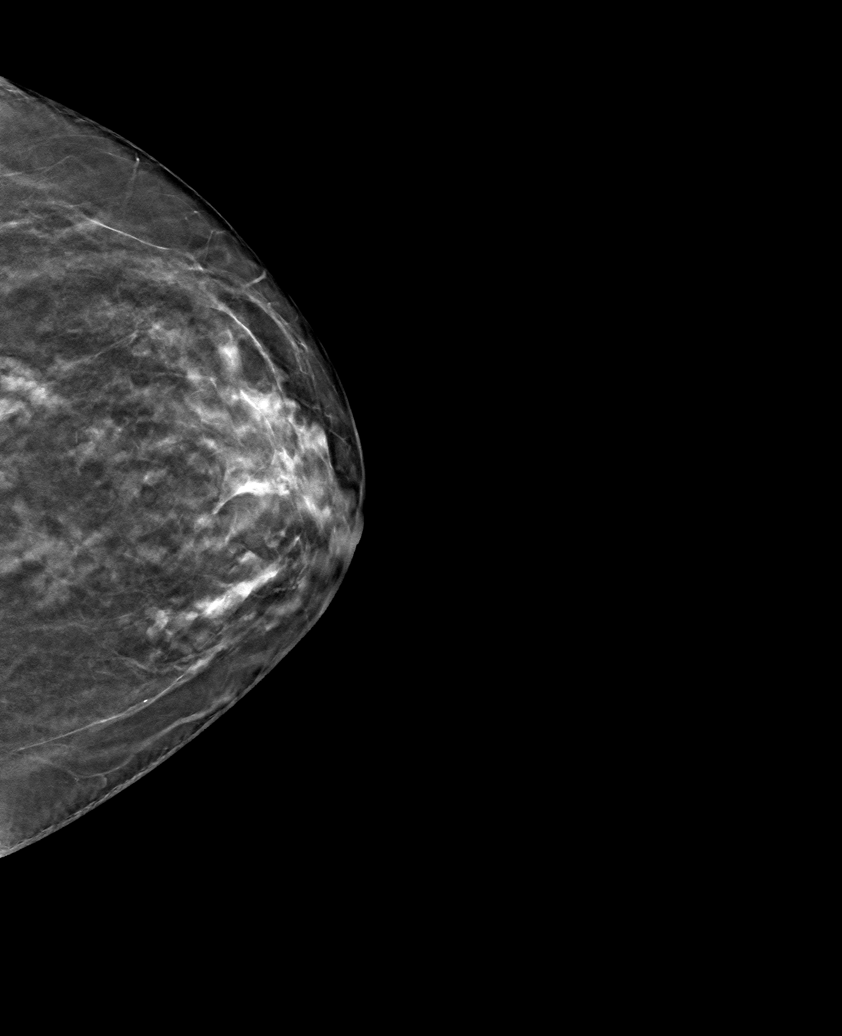

[6 of 30 positions shown; findings below may reference images not displayed]

ACR Breast Density Category c: The breast tissue is heterogeneously
dense, which may obscure small masses.
FINDINGS: There are no findings suspicious for malignancy.
IMPRESSION: No mammographic evidence of malignancy. A result letter of this
screening mammogram will be mailed directly to the patient.

RECOMMENDATION:
Screening mammogram in one year. (Code:Q3-W-BC3)

BI-RADS CATEGORY  1: Negative.

## 2023-11-15 DIAGNOSIS — B3731 Acute candidiasis of vulva and vagina: Secondary | ICD-10-CM | POA: Diagnosis not present

## 2023-11-15 DIAGNOSIS — L03116 Cellulitis of left lower limb: Secondary | ICD-10-CM | POA: Diagnosis not present

## 2023-11-15 DIAGNOSIS — Z1330 Encounter for screening examination for mental health and behavioral disorders, unspecified: Secondary | ICD-10-CM | POA: Diagnosis not present

## 2023-11-15 DIAGNOSIS — S81802D Unspecified open wound, left lower leg, subsequent encounter: Secondary | ICD-10-CM | POA: Diagnosis not present

## 2023-11-15 DIAGNOSIS — Z0000 Encounter for general adult medical examination without abnormal findings: Secondary | ICD-10-CM | POA: Diagnosis not present

## 2023-11-15 DIAGNOSIS — I10 Essential (primary) hypertension: Secondary | ICD-10-CM | POA: Diagnosis not present

## 2023-11-15 DIAGNOSIS — S8992XA Unspecified injury of left lower leg, initial encounter: Secondary | ICD-10-CM | POA: Diagnosis not present

## 2023-11-19 DIAGNOSIS — B3731 Acute candidiasis of vulva and vagina: Secondary | ICD-10-CM | POA: Diagnosis not present

## 2023-11-19 DIAGNOSIS — L03116 Cellulitis of left lower limb: Secondary | ICD-10-CM | POA: Diagnosis not present

## 2023-11-19 DIAGNOSIS — Z0000 Encounter for general adult medical examination without abnormal findings: Secondary | ICD-10-CM | POA: Diagnosis not present

## 2023-11-19 DIAGNOSIS — S8992XA Unspecified injury of left lower leg, initial encounter: Secondary | ICD-10-CM | POA: Diagnosis not present

## 2023-11-19 DIAGNOSIS — S81802D Unspecified open wound, left lower leg, subsequent encounter: Secondary | ICD-10-CM | POA: Diagnosis not present

## 2023-11-19 DIAGNOSIS — Z1330 Encounter for screening examination for mental health and behavioral disorders, unspecified: Secondary | ICD-10-CM | POA: Diagnosis not present

## 2023-11-19 DIAGNOSIS — I10 Essential (primary) hypertension: Secondary | ICD-10-CM | POA: Diagnosis not present

## 2023-11-23 DIAGNOSIS — I10 Essential (primary) hypertension: Secondary | ICD-10-CM | POA: Diagnosis not present

## 2023-11-23 DIAGNOSIS — S81802D Unspecified open wound, left lower leg, subsequent encounter: Secondary | ICD-10-CM | POA: Diagnosis not present

## 2023-11-23 DIAGNOSIS — L03116 Cellulitis of left lower limb: Secondary | ICD-10-CM | POA: Diagnosis not present

## 2023-11-23 DIAGNOSIS — Z0000 Encounter for general adult medical examination without abnormal findings: Secondary | ICD-10-CM | POA: Diagnosis not present

## 2023-11-23 DIAGNOSIS — Z1330 Encounter for screening examination for mental health and behavioral disorders, unspecified: Secondary | ICD-10-CM | POA: Diagnosis not present

## 2023-11-23 DIAGNOSIS — S8992XA Unspecified injury of left lower leg, initial encounter: Secondary | ICD-10-CM | POA: Diagnosis not present

## 2023-11-27 DIAGNOSIS — Z1330 Encounter for screening examination for mental health and behavioral disorders, unspecified: Secondary | ICD-10-CM | POA: Diagnosis not present

## 2023-11-27 DIAGNOSIS — Z0000 Encounter for general adult medical examination without abnormal findings: Secondary | ICD-10-CM | POA: Diagnosis not present

## 2023-11-27 DIAGNOSIS — S8992XA Unspecified injury of left lower leg, initial encounter: Secondary | ICD-10-CM | POA: Diagnosis not present

## 2023-11-27 DIAGNOSIS — L03116 Cellulitis of left lower limb: Secondary | ICD-10-CM | POA: Diagnosis not present

## 2023-11-27 DIAGNOSIS — S81802D Unspecified open wound, left lower leg, subsequent encounter: Secondary | ICD-10-CM | POA: Diagnosis not present

## 2023-11-27 DIAGNOSIS — I10 Essential (primary) hypertension: Secondary | ICD-10-CM | POA: Diagnosis not present

## 2023-11-30 DIAGNOSIS — S81802D Unspecified open wound, left lower leg, subsequent encounter: Secondary | ICD-10-CM | POA: Diagnosis not present

## 2023-11-30 DIAGNOSIS — S8992XA Unspecified injury of left lower leg, initial encounter: Secondary | ICD-10-CM | POA: Diagnosis not present

## 2023-11-30 DIAGNOSIS — I10 Essential (primary) hypertension: Secondary | ICD-10-CM | POA: Diagnosis not present

## 2023-11-30 DIAGNOSIS — Z0000 Encounter for general adult medical examination without abnormal findings: Secondary | ICD-10-CM | POA: Diagnosis not present

## 2023-11-30 DIAGNOSIS — L03116 Cellulitis of left lower limb: Secondary | ICD-10-CM | POA: Diagnosis not present

## 2023-11-30 DIAGNOSIS — Z1330 Encounter for screening examination for mental health and behavioral disorders, unspecified: Secondary | ICD-10-CM | POA: Diagnosis not present

## 2023-12-03 DIAGNOSIS — L03116 Cellulitis of left lower limb: Secondary | ICD-10-CM | POA: Diagnosis not present

## 2023-12-03 DIAGNOSIS — S8992XA Unspecified injury of left lower leg, initial encounter: Secondary | ICD-10-CM | POA: Diagnosis not present

## 2023-12-03 DIAGNOSIS — B3731 Acute candidiasis of vulva and vagina: Secondary | ICD-10-CM | POA: Diagnosis not present

## 2023-12-03 DIAGNOSIS — I10 Essential (primary) hypertension: Secondary | ICD-10-CM | POA: Diagnosis not present

## 2023-12-03 DIAGNOSIS — Z1330 Encounter for screening examination for mental health and behavioral disorders, unspecified: Secondary | ICD-10-CM | POA: Diagnosis not present

## 2023-12-03 DIAGNOSIS — Z0000 Encounter for general adult medical examination without abnormal findings: Secondary | ICD-10-CM | POA: Diagnosis not present

## 2023-12-03 DIAGNOSIS — S81802D Unspecified open wound, left lower leg, subsequent encounter: Secondary | ICD-10-CM | POA: Diagnosis not present

## 2024-08-29 ENCOUNTER — Other Ambulatory Visit: Payer: Self-pay | Admitting: Cardiovascular Disease

## 2024-08-29 DIAGNOSIS — I493 Ventricular premature depolarization: Secondary | ICD-10-CM

## 2024-10-02 ENCOUNTER — Other Ambulatory Visit: Payer: Self-pay | Admitting: Cardiovascular Disease

## 2024-10-02 DIAGNOSIS — I493 Ventricular premature depolarization: Secondary | ICD-10-CM

## 2024-11-02 ENCOUNTER — Other Ambulatory Visit: Payer: Self-pay | Admitting: Cardiovascular Disease

## 2024-11-02 DIAGNOSIS — I493 Ventricular premature depolarization: Secondary | ICD-10-CM
